# Patient Record
Sex: Female | Born: 1960
Health system: Southern US, Community
[De-identification: ages and names within clinical notes are randomized; demographics above are authoritative.]

## PROBLEM LIST (undated history)

## (undated) DIAGNOSIS — F32A Depression, unspecified: Secondary | ICD-10-CM

## (undated) DIAGNOSIS — K279 Peptic ulcer, site unspecified, unspecified as acute or chronic, without hemorrhage or perforation: Secondary | ICD-10-CM

## (undated) DIAGNOSIS — F419 Anxiety disorder, unspecified: Secondary | ICD-10-CM

## (undated) DIAGNOSIS — I10 Essential (primary) hypertension: Secondary | ICD-10-CM

## (undated) DIAGNOSIS — E119 Type 2 diabetes mellitus without complications: Secondary | ICD-10-CM

## (undated) DIAGNOSIS — F329 Major depressive disorder, single episode, unspecified: Secondary | ICD-10-CM

## (undated) DIAGNOSIS — Z9289 Personal history of other medical treatment: Secondary | ICD-10-CM

## (undated) DIAGNOSIS — E789 Disorder of lipoprotein metabolism, unspecified: Secondary | ICD-10-CM

## (undated) HISTORY — DX: Major depressive disorder, single episode, unspecified: F32.9

## (undated) HISTORY — DX: Disorder of lipoprotein metabolism, unspecified: E78.9

## (undated) HISTORY — DX: Personal history of other medical treatment: Z92.89

## (undated) HISTORY — DX: Essential (primary) hypertension: I10

## (undated) HISTORY — DX: Depression, unspecified: F32.A

## (undated) HISTORY — DX: Anxiety disorder, unspecified: F41.9

## (undated) HISTORY — DX: Type 2 diabetes mellitus without complications: E11.9

---

## 1994-02-07 DIAGNOSIS — K279 Peptic ulcer, site unspecified, unspecified as acute or chronic, without hemorrhage or perforation: Secondary | ICD-10-CM | POA: Insufficient documentation

## 1994-02-07 HISTORY — DX: Peptic ulcer, site unspecified, unspecified as acute or chronic, without hemorrhage or perforation: K27.9

## 1998-09-22 ENCOUNTER — Emergency Department (HOSPITAL_COMMUNITY): Admission: EM | Admit: 1998-09-22 | Discharge: 1998-09-22 | Payer: Self-pay | Admitting: Emergency Medicine

## 1999-08-04 ENCOUNTER — Emergency Department (HOSPITAL_COMMUNITY): Admission: EM | Admit: 1999-08-04 | Discharge: 1999-08-04 | Payer: Self-pay | Admitting: Emergency Medicine

## 1999-11-25 ENCOUNTER — Other Ambulatory Visit: Admission: RE | Admit: 1999-11-25 | Discharge: 1999-11-25 | Payer: Self-pay | Admitting: Family Medicine

## 1999-11-26 ENCOUNTER — Ambulatory Visit (HOSPITAL_COMMUNITY): Admission: RE | Admit: 1999-11-26 | Discharge: 1999-11-26 | Payer: Self-pay | Admitting: Family Medicine

## 1999-11-26 ENCOUNTER — Encounter: Payer: Self-pay | Admitting: Family Medicine

## 2000-08-21 ENCOUNTER — Encounter: Payer: Self-pay | Admitting: Emergency Medicine

## 2000-08-21 ENCOUNTER — Emergency Department (HOSPITAL_COMMUNITY): Admission: EM | Admit: 2000-08-21 | Discharge: 2000-08-21 | Payer: Self-pay | Admitting: Emergency Medicine

## 2000-11-28 ENCOUNTER — Ambulatory Visit (HOSPITAL_COMMUNITY): Admission: RE | Admit: 2000-11-28 | Discharge: 2000-11-28 | Payer: Self-pay | Admitting: Family Medicine

## 2000-11-28 ENCOUNTER — Encounter: Payer: Self-pay | Admitting: Family Medicine

## 2001-07-19 ENCOUNTER — Encounter: Payer: Self-pay | Admitting: Emergency Medicine

## 2001-07-19 ENCOUNTER — Emergency Department (HOSPITAL_COMMUNITY): Admission: EM | Admit: 2001-07-19 | Discharge: 2001-07-19 | Payer: Self-pay | Admitting: Emergency Medicine

## 2001-11-29 ENCOUNTER — Encounter: Payer: Self-pay | Admitting: Family Medicine

## 2001-11-29 ENCOUNTER — Ambulatory Visit (HOSPITAL_COMMUNITY): Admission: RE | Admit: 2001-11-29 | Discharge: 2001-11-29 | Payer: Self-pay | Admitting: Family Medicine

## 2003-02-08 HISTORY — PX: ABDOMINAL HYSTERECTOMY: SHX81

## 2003-02-08 HISTORY — PX: CHOLECYSTECTOMY: SHX55

## 2004-06-01 ENCOUNTER — Inpatient Hospital Stay (HOSPITAL_COMMUNITY): Admission: EM | Admit: 2004-06-01 | Discharge: 2004-06-04 | Payer: Self-pay | Admitting: Emergency Medicine

## 2005-08-24 IMAGING — CT CT PELVIS W/ CM
1 of 3 series · 14 of 32 positions shown, 19 images · IV contrast (omnipaque)
Comparison: none

CLINICAL DATA: Right thigh infection.
 CT PELVIS WITH CONTRAST:
TECHNIQUE: 100 cc Omnipaque 300.  Images were obtained through the pelvis and proximal thighs.   
 The uterus is lobulated and enlarged likely due to uterine fibroids.   The bladder is mildly distended.  Adenopathy is present in the right groin due to an inflammatory process.   There is thickening and stranding involving the musculature of the right proximal thigh; involving the adductor muscles and pectineus.  Stranding is present in the subcutaneous fat of the medial thigh.   There is a focal ill-defined fluid collection in the subcutaneous fat of the medial right proximal thigh measuring 2.9 x 5.6 cm image 8 series 3.

[Series 2: pel 5.0 b40f st · axial · 0.88mm/px · z∈[+746,+940]mm · 14 of 45 slices shown, 19 images]
[im 3/45  soft-tissue]
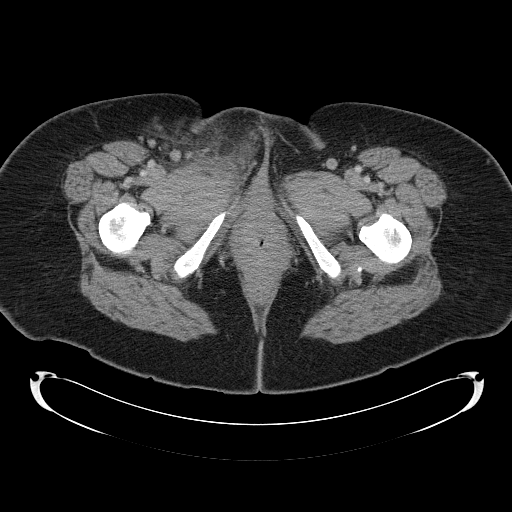
[im 3/45  bone]
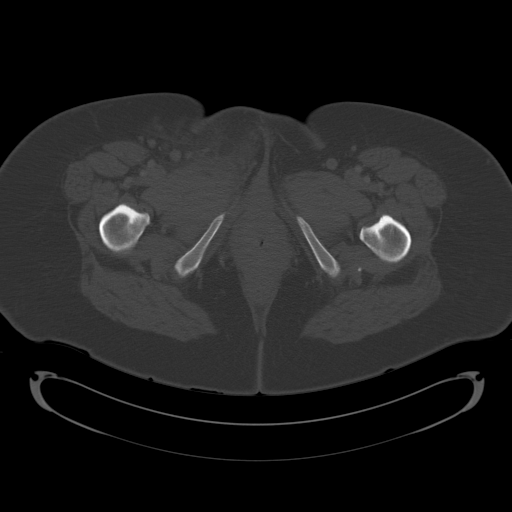
[im 6/45  soft-tissue]
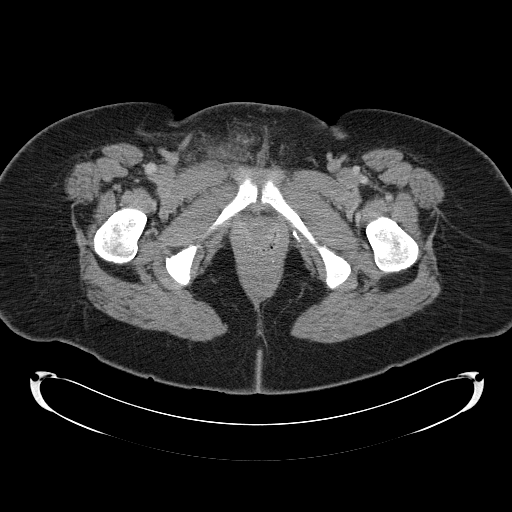
[im 11/45  soft-tissue]
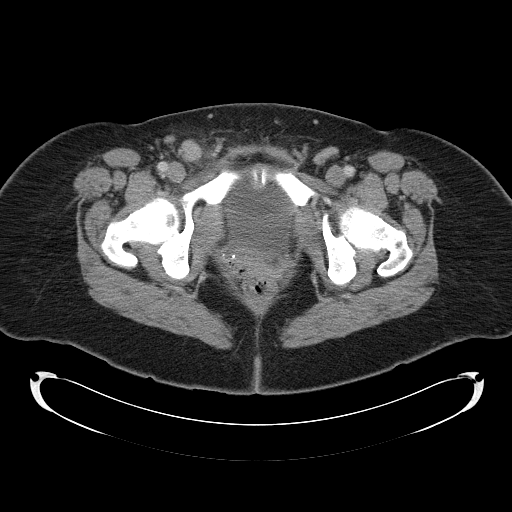
[im 13/45  soft-tissue]
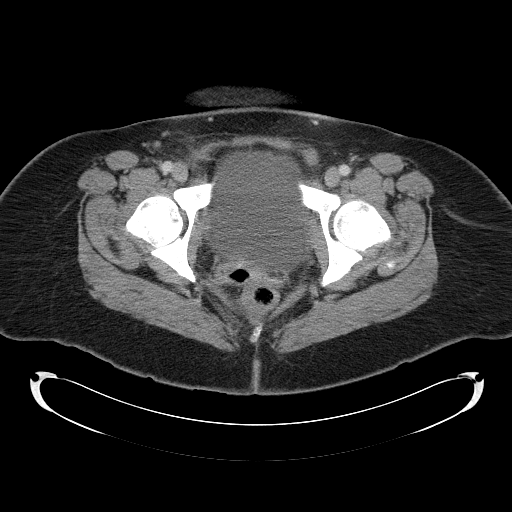
[im 16/45  soft-tissue]
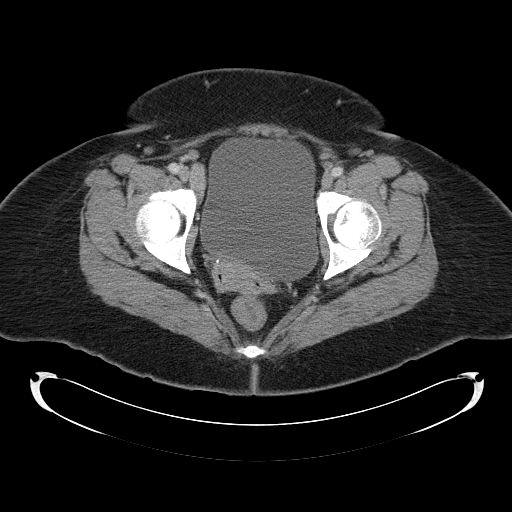
[im 19/45  soft-tissue]
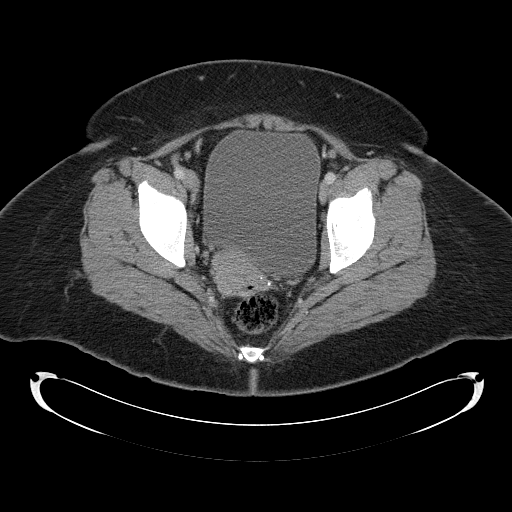
[im 24/45  soft-tissue]
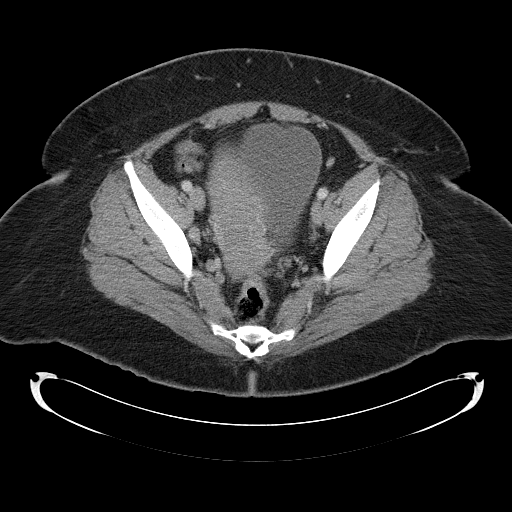
[im 26/45  soft-tissue]
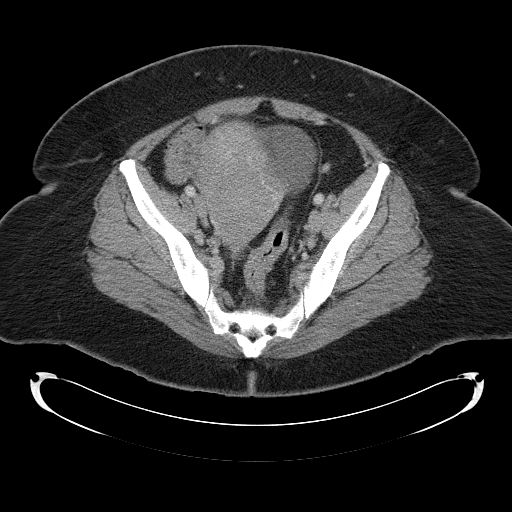
[im 29/45  soft-tissue]
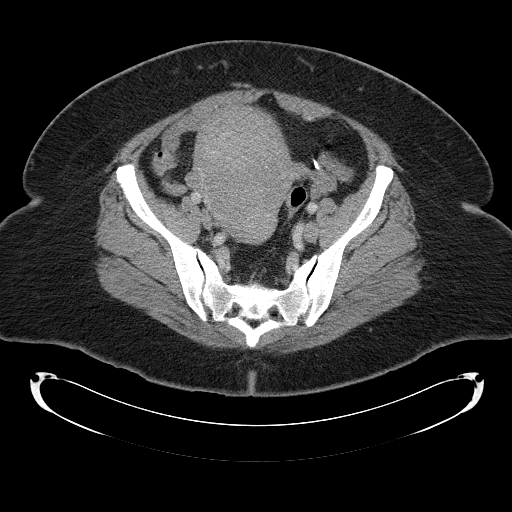
[im 29/45  bone]
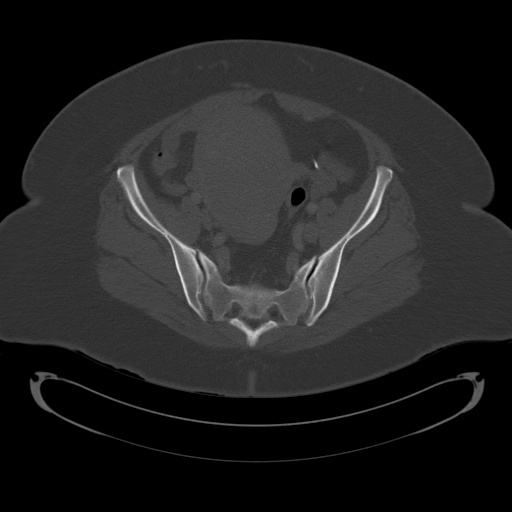
[im 32/45  soft-tissue]
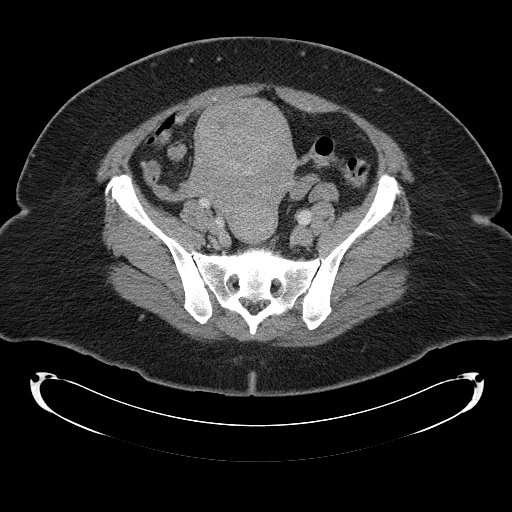
[im 34/45  soft-tissue]
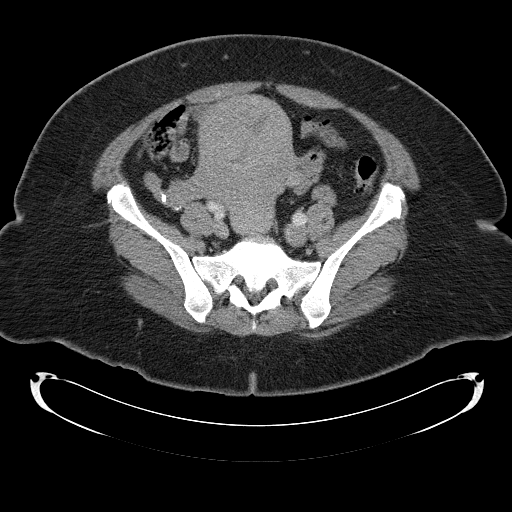
[im 34/45  lung]
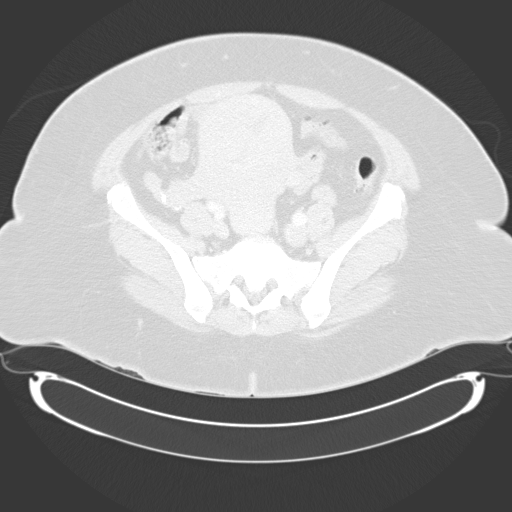
[im 37/45  lung]
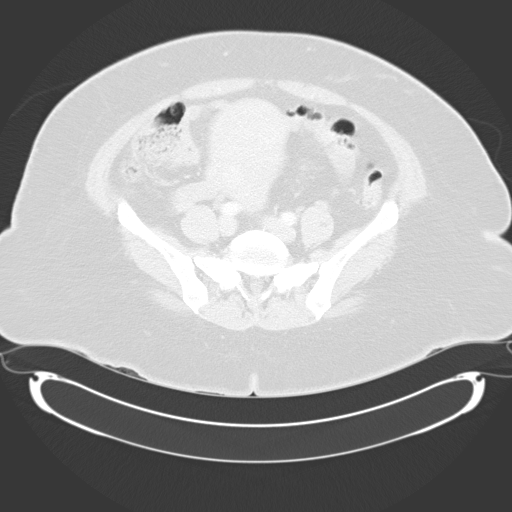
[im 39/45  soft-tissue]
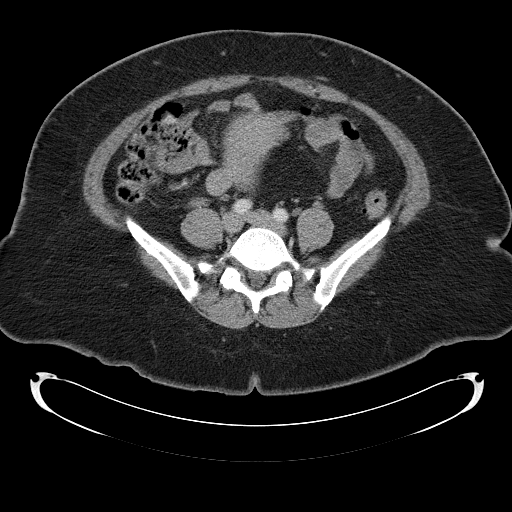
[im 39/45  lung]
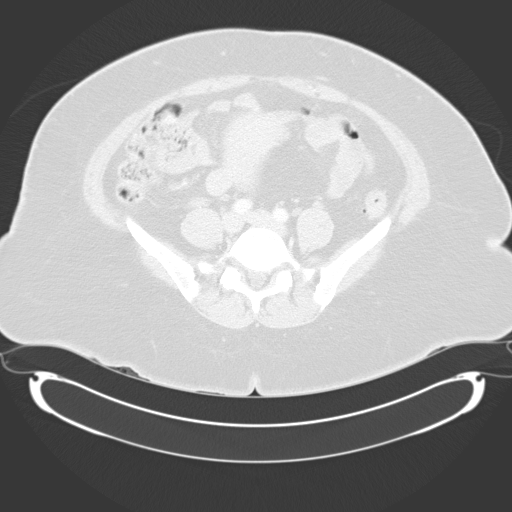
[im 42/45  soft-tissue]
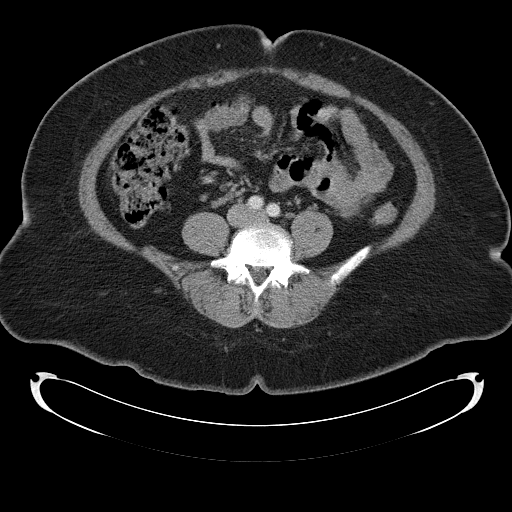
[im 42/45  lung]
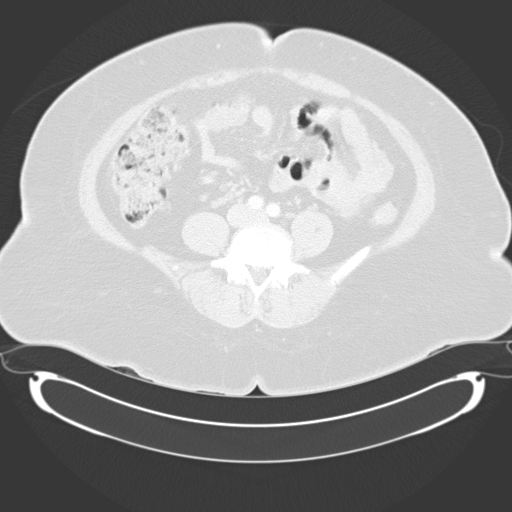

[14 of 32 positions shown; findings below may reference images not displayed]

IMPRESSION: 1.  Enlarged lobulated uterus likely due to fibroids.  This can be further characterized with ultrasound. 
 2.  Inflammatory process in the medial right thigh involving the pectineus muscle, adductor muscles, and subcutaneous fat.  An ill-defined fluid collection within the subcutaneous fat measuring 5.6 cm in greatest dimension is present worrisome for developing abscess.

## 2012-02-08 HISTORY — PX: COLONOSCOPY: SHX174

## 2012-02-08 HISTORY — PX: GASTRIC BYPASS: SHX52

## 2014-02-07 DIAGNOSIS — Z9289 Personal history of other medical treatment: Secondary | ICD-10-CM

## 2014-02-07 HISTORY — DX: Personal history of other medical treatment: Z92.89

## 2014-04-16 DIAGNOSIS — E109 Type 1 diabetes mellitus without complications: Secondary | ICD-10-CM | POA: Diagnosis not present

## 2014-07-24 DIAGNOSIS — E782 Mixed hyperlipidemia: Secondary | ICD-10-CM | POA: Diagnosis not present

## 2014-07-24 DIAGNOSIS — I10 Essential (primary) hypertension: Secondary | ICD-10-CM | POA: Diagnosis not present

## 2014-07-24 DIAGNOSIS — F064 Anxiety disorder due to known physiological condition: Secondary | ICD-10-CM | POA: Diagnosis not present

## 2014-07-24 DIAGNOSIS — E0800 Diabetes mellitus due to underlying condition with hyperosmolarity without nonketotic hyperglycemic-hyperosmolar coma (NKHHC): Secondary | ICD-10-CM | POA: Diagnosis not present

## 2014-07-24 DIAGNOSIS — E6609 Other obesity due to excess calories: Secondary | ICD-10-CM | POA: Diagnosis not present

## 2014-08-13 DIAGNOSIS — F064 Anxiety disorder due to known physiological condition: Secondary | ICD-10-CM | POA: Diagnosis not present

## 2014-08-13 DIAGNOSIS — E0800 Diabetes mellitus due to underlying condition with hyperosmolarity without nonketotic hyperglycemic-hyperosmolar coma (NKHHC): Secondary | ICD-10-CM | POA: Diagnosis not present

## 2014-08-13 DIAGNOSIS — E559 Vitamin D deficiency, unspecified: Secondary | ICD-10-CM | POA: Diagnosis not present

## 2014-08-13 DIAGNOSIS — E6609 Other obesity due to excess calories: Secondary | ICD-10-CM | POA: Diagnosis not present

## 2014-08-13 DIAGNOSIS — B159 Hepatitis A without hepatic coma: Secondary | ICD-10-CM | POA: Diagnosis not present

## 2014-08-13 DIAGNOSIS — E782 Mixed hyperlipidemia: Secondary | ICD-10-CM | POA: Diagnosis not present

## 2014-08-14 DIAGNOSIS — E0800 Diabetes mellitus due to underlying condition with hyperosmolarity without nonketotic hyperglycemic-hyperosmolar coma (NKHHC): Secondary | ICD-10-CM | POA: Diagnosis not present

## 2014-10-02 DIAGNOSIS — E0800 Diabetes mellitus due to underlying condition with hyperosmolarity without nonketotic hyperglycemic-hyperosmolar coma (NKHHC): Secondary | ICD-10-CM | POA: Diagnosis not present

## 2014-10-02 DIAGNOSIS — E782 Mixed hyperlipidemia: Secondary | ICD-10-CM | POA: Diagnosis not present

## 2014-10-02 DIAGNOSIS — I10 Essential (primary) hypertension: Secondary | ICD-10-CM | POA: Diagnosis not present

## 2014-10-02 DIAGNOSIS — F068 Other specified mental disorders due to known physiological condition: Secondary | ICD-10-CM | POA: Diagnosis not present

## 2015-01-05 DIAGNOSIS — E782 Mixed hyperlipidemia: Secondary | ICD-10-CM | POA: Diagnosis not present

## 2015-01-05 DIAGNOSIS — E0800 Diabetes mellitus due to underlying condition with hyperosmolarity without nonketotic hyperglycemic-hyperosmolar coma (NKHHC): Secondary | ICD-10-CM | POA: Diagnosis not present

## 2015-01-05 DIAGNOSIS — E049 Nontoxic goiter, unspecified: Secondary | ICD-10-CM | POA: Diagnosis not present

## 2015-01-05 DIAGNOSIS — I10 Essential (primary) hypertension: Secondary | ICD-10-CM | POA: Diagnosis not present

## 2015-01-05 DIAGNOSIS — Z6832 Body mass index (BMI) 32.0-32.9, adult: Secondary | ICD-10-CM | POA: Diagnosis not present

## 2015-01-05 DIAGNOSIS — E089 Diabetes mellitus due to underlying condition without complications: Secondary | ICD-10-CM | POA: Diagnosis not present

## 2015-01-05 DIAGNOSIS — F064 Anxiety disorder due to known physiological condition: Secondary | ICD-10-CM | POA: Diagnosis not present

## 2015-01-08 DIAGNOSIS — F064 Anxiety disorder due to known physiological condition: Secondary | ICD-10-CM | POA: Diagnosis not present

## 2015-01-08 DIAGNOSIS — E782 Mixed hyperlipidemia: Secondary | ICD-10-CM | POA: Diagnosis not present

## 2015-01-08 DIAGNOSIS — E089 Diabetes mellitus due to underlying condition without complications: Secondary | ICD-10-CM | POA: Diagnosis not present

## 2015-01-08 DIAGNOSIS — I10 Essential (primary) hypertension: Secondary | ICD-10-CM | POA: Diagnosis not present

## 2015-01-19 ENCOUNTER — Telehealth: Payer: Self-pay | Admitting: Oncology

## 2015-01-19 NOTE — Telephone Encounter (Signed)
LT MESS REGARDING NEW PT REFERRAL °

## 2015-01-26 ENCOUNTER — Telehealth: Payer: Self-pay | Admitting: Oncology

## 2015-01-26 NOTE — Telephone Encounter (Signed)
LT MESS REGARDING NEW PT APPT  °

## 2015-01-27 ENCOUNTER — Telehealth: Payer: Self-pay | Admitting: Hematology and Oncology

## 2015-01-27 NOTE — Telephone Encounter (Signed)
new patient appt-s/w patient and gave np appt for 01/03 @ 3:45 w/Dr. Pamelia HoitGudena Referring Dr. Renaye RakersVeita Bland Dx-Anemia- s/p gastric bypass   Referral  [facility]

## 2015-02-09 DIAGNOSIS — H40013 Open angle with borderline findings, low risk, bilateral: Secondary | ICD-10-CM | POA: Diagnosis not present

## 2015-02-09 DIAGNOSIS — H25813 Combined forms of age-related cataract, bilateral: Secondary | ICD-10-CM | POA: Diagnosis not present

## 2015-02-09 DIAGNOSIS — E089 Diabetes mellitus due to underlying condition without complications: Secondary | ICD-10-CM | POA: Diagnosis not present

## 2015-02-09 DIAGNOSIS — E782 Mixed hyperlipidemia: Secondary | ICD-10-CM | POA: Diagnosis not present

## 2015-02-09 DIAGNOSIS — Z23 Encounter for immunization: Secondary | ICD-10-CM | POA: Diagnosis not present

## 2015-02-09 DIAGNOSIS — D530 Protein deficiency anemia: Secondary | ICD-10-CM | POA: Diagnosis not present

## 2015-02-10 ENCOUNTER — Encounter: Payer: Self-pay | Admitting: Hematology and Oncology

## 2015-02-10 ENCOUNTER — Ambulatory Visit (HOSPITAL_BASED_OUTPATIENT_CLINIC_OR_DEPARTMENT_OTHER): Payer: Commercial Managed Care - HMO | Admitting: Hematology and Oncology

## 2015-02-10 VITALS — BP 135/75 | HR 65 | Temp 98.0°F | Resp 18 | Ht 66.0 in | Wt 195.6 lb

## 2015-02-10 DIAGNOSIS — D649 Anemia, unspecified: Secondary | ICD-10-CM | POA: Diagnosis not present

## 2015-02-10 NOTE — Assessment & Plan Note (Signed)
Normocytic anemia with a prior history of gastric bypass surgery 2 years ago with a 60 pound weight loss,  Most recent hemoglobin November 2016 was 10.9 with a normal differential  I discussed with her the mechanism of anemia and its different causes.   Differential diagnosis: 1.  Blood loss: Will obtain stool Hemoccult cards 2.  Combined iron and B-12 deficiencies: We will obtain iron studies and B-12 and folic acid levels. Patient gets B-12 injections once every 3 months. 3.   Hemolysis : We will obtain haptoglobin LDH and reticulocyte count   Patient will have this blood work on Friday morning and we will see her on Friday to review the results of this blood work.  Further recommendations based on results of the blood tests.

## 2015-02-10 NOTE — Progress Notes (Signed)
[facility] CONSULT NOTE  No care team member to display  CHIEF COMPLAINTS/PURPOSE OF CONSULTATION:  Newly diagnosed  anemia  HISTORY OF PRESENTING ILLNESS:  Chelsea Rodriguez 55 y.o. female is here because of recent diagnosis of  Normocytic anemia with a hemoglobin of 10.9. She has a history of gastric bypass surgery in 2014 at [facility]. Since then she had lost 60 pounds. She works currently at [employer] and stays very active. She watches what she eats. She has noted on recent blood work by her primary care physician to have hemoglobin of 10.9 which was decreased from before.  She was referred to [place] for further evaluation of this.  Patient does not report any symptoms of anemia. She feels great having lost 60 pounds.  MEDICAL HISTORY:  Diabetes, GERD, hypertension, hyperlipidemia  SURGICAL HISTORY: hysterectomy, cholecystectomy, gastric bypass surgery  SOCIAL HISTORY:  Smoke cigarettes , denies any alcohol previous marijuana use FAMILY HISTORY:  Her nephew has anemia but otherwise no cancer history;  Father deceased from diabetes and heart disease, mother is 60 years deceased from diabetes heart disease and kidney failure  ALLERGIES:  has no allergies on file.  MEDICATIONS:  Amlodipine 5 mg daily, Lipitor 20 mg daily, Levemir 27 in a.m. And 25 in p.m. , metformin 1000 by mouth twice a day, NovoLog 15 a.m. 15 with lunch and 10 at bedtime, omeprazole 40 mg daily, potassium 20 mEq daily, ranitidine 150 mg daily, B-12 injection 1000 g every 3 months  REVIEW OF SYSTEMS:   Constitutional: Denies fevers, chills or abnormal night sweats Eyes: Denies blurriness of vision, double vision or watery eyes Ears, nose, mouth, throat, and face: Denies mucositis or sore throat Respiratory: Denies cough, dyspnea or wheezes Cardiovascular: Denies palpitation, chest discomfort or lower extremity swelling Gastrointestinal:  Denies nausea, heartburn or change in  bowel habits Skin: Denies abnormal skin rashes Lymphatics: Denies new lymphadenopathy or easy bruising Neurological:Denies numbness, tingling or new weaknesses Behavioral/Psych: Mood is stable, no new changes   All other systems were reviewed with the patient and are negative.  PHYSICAL EXAMINATION: ECOG PERFORMANCE STATUS: 0 - Asymptomatic  Filed Vitals:   02/10/15 1640  BP: 135/75  Pulse: 65  Temp: 98 F (36.7 C)  Resp: 18   Filed Weights   02/10/15 1640  Weight: 195 lb 9.6 oz (88.724 kg)    GENERAL:alert, no distress and comfortable SKIN: skin color, texture, turgor are normal, no rashes or significant lesions EYES: normal, conjunctiva are pink and non-injected, sclera clear OROPHARYNX:no exudate, no erythema and lips, buccal mucosa, and tongue normal  NECK: supple, thyroid normal size, non-tender, without nodularity LYMPH:  no palpable lymphadenopathy in the cervical, axillary or inguinal LUNGS: clear to auscultation and percussion with normal breathing effort HEART: regular rate & rhythm and no murmurs and no lower extremity edema ABDOMEN:abdomen soft, non-tender and normal bowel sounds Musculoskeletal:no cyanosis of digits and no clubbing  PSYCH: alert & oriented x 3 with fluent speech NEURO: no focal motor/sensory deficits   ASSESSMENT AND PLAN:  Normocytic anemia  Normocytic anemia with a prior history of gastric bypass surgery 2 years ago with a 60 pound weight loss,  Most recent hemoglobin November 2016 was 10.9 with a normal differential  I discussed with her the mechanism of anemia and its different causes.   Differential diagnosis: 1.  Blood loss: Will obtain stool Hemoccult cards 2.  Combined iron and B-12 deficiencies: We will obtain iron studies and B-12 and  folic acid levels. Patient gets B-12 injections once every 3 months. 3.   Hemolysis : We will obtain haptoglobin LDH and reticulocyte count   Patient will have this blood work on Friday morning and  we will see her on Friday to review the results of this blood work.  Further recommendations based on results of the blood tests.   All questions were answered. The patient knows to call the clinic with any problems, questions or concerns.    Sabas SousGudena, Aleese Kamps K, MD 02/10/2015

## 2015-02-13 ENCOUNTER — Other Ambulatory Visit (HOSPITAL_BASED_OUTPATIENT_CLINIC_OR_DEPARTMENT_OTHER): Payer: Commercial Managed Care - HMO

## 2015-02-13 ENCOUNTER — Ambulatory Visit (HOSPITAL_BASED_OUTPATIENT_CLINIC_OR_DEPARTMENT_OTHER): Payer: Commercial Managed Care - HMO | Admitting: Hematology and Oncology

## 2015-02-13 ENCOUNTER — Encounter: Payer: Self-pay | Admitting: Hematology and Oncology

## 2015-02-13 VITALS — BP 127/73 | HR 72 | Temp 98.1°F | Resp 18 | Ht 66.0 in | Wt 192.9 lb

## 2015-02-13 DIAGNOSIS — Z9884 Bariatric surgery status: Secondary | ICD-10-CM | POA: Diagnosis not present

## 2015-02-13 DIAGNOSIS — Z862 Personal history of diseases of the blood and blood-forming organs and certain disorders involving the immune mechanism: Secondary | ICD-10-CM

## 2015-02-13 DIAGNOSIS — D649 Anemia, unspecified: Secondary | ICD-10-CM

## 2015-02-13 LAB — CBC & DIFF AND RETIC
BASO%: 0.4 % (ref 0.0–2.0)
BASOS ABS: 0 10*3/uL (ref 0.0–0.1)
EOS%: 1.2 % (ref 0.0–7.0)
Eosinophils Absolute: 0.1 10*3/uL (ref 0.0–0.5)
HCT: 37.3 % (ref 34.8–46.6)
HGB: 12.5 g/dL (ref 11.6–15.9)
IMMATURE RETIC FRACT: 2.2 % (ref 1.60–10.00)
LYMPH#: 1.8 10*3/uL (ref 0.9–3.3)
LYMPH%: 31.3 % (ref 14.0–49.7)
MCH: 28.7 pg (ref 25.1–34.0)
MCHC: 33.5 g/dL (ref 31.5–36.0)
MCV: 85.7 fL (ref 79.5–101.0)
MONO#: 0.4 10*3/uL (ref 0.1–0.9)
MONO%: 6.2 % (ref 0.0–14.0)
NEUT#: 3.4 10*3/uL (ref 1.5–6.5)
NEUT%: 60.9 % (ref 38.4–76.8)
Platelets: 277 10*3/uL (ref 145–400)
RBC: 4.35 10*6/uL (ref 3.70–5.45)
RDW: 13.1 % (ref 11.2–14.5)
RETIC CT ABS: 46.11 10*3/uL (ref 33.70–90.70)
Retic %: 1.06 % (ref 0.70–2.10)
WBC: 5.6 10*3/uL (ref 3.9–10.3)

## 2015-02-13 LAB — IRON AND TIBC
%SAT: 26 % (ref 21–57)
IRON: 93 ug/dL (ref 41–142)
TIBC: 354 ug/dL (ref 236–444)
UIBC: 261 ug/dL (ref 120–384)

## 2015-02-13 LAB — FERRITIN: FERRITIN: 65 ng/mL (ref 9–269)

## 2015-02-13 LAB — FECAL OCCULT BLOOD, GUAIAC: Occult Blood: NEGATIVE

## 2015-02-13 LAB — LACTATE DEHYDROGENASE: LDH: 161 U/L (ref 125–245)

## 2015-02-13 NOTE — Progress Notes (Signed)
No care team member to display  DIAGNOSIS: normocytic anemia.  CHIEF COMPLIANT: follow-up of blood work  INTERVAL HISTORY: Chelsea Rodriguez is a 55 year old with above-mentioned history of normocytic anemia who came in for a evaluation. Repeat blood work done today revealed that her hemoglobin is back to normal. I suspect that she temporarily had anemia of inflammation and subsequently went inflammation subsided, her symptoms normalize. Her iron studies were normal. It does not appear that she has any current iron or B-12 deficiencies. Stool Hemoccults were negative.  REVIEW OF SYSTEMS:   Constitutional: Denies fevers, chills or abnormal weight loss Eyes: Denies blurriness of vision Ears, nose, mouth, throat, and face: Denies mucositis or sore throat Respiratory: Denies cough, dyspnea or wheezes Cardiovascular: Denies palpitation, chest discomfort Gastrointestinal:  Denies nausea, heartburn or change in bowel habits Skin: Denies abnormal skin rashes Lymphatics: Denies new lymphadenopathy or easy bruising Neurological:Denies numbness, tingling or new weaknesses Behavioral/Psych: Mood is stable, no new changes  Extremities: No lower extremity edema All other systems were reviewed with the patient and are negative.  I have reviewed the past medical history, past surgical history, social history and family history with the patient and they are unchanged from previous note.  ALLERGIES:  has no allergies on file.  MEDICATIONS:  Current Outpatient Prescriptions  Medication Sig Dispense Refill  . amLODipine (NORVASC) 5 MG tablet Take 5 mg by mouth daily.    Marland Kitchen. atorvastatin (LIPITOR) 20 MG tablet Take 20 mg by mouth daily.    . insulin aspart (NOVOLOG) 100 UNIT/ML injection Inject into the skin 3 (three) times daily with meals. AM - 15 units.  Lunch - 15 units.  Bedtime - 10 units    . Insulin Detemir (LEVEMIR [place]) Inject 27 Units into the skin every morning.    . Insulin Detemir (LEVEMIR  [place]) Inject 25 Units into the skin at bedtime.    . metFORMIN (GLUMETZA) 1000 MG (MOD) 24 hr tablet Take 1,000 mg by mouth 2 (two) times daily.    Marland Kitchen. omeprazole (PRILOSEC) 40 MG capsule Take 40 mg by mouth daily.    . potassium chloride SA (K-DUR,KLOR-CON) 20 MEQ tablet Take 20 mEq by mouth daily.    . ranitidine (ZANTAC) 150 MG capsule Take 150 mg by mouth daily.     No current facility-administered medications for this visit.    PHYSICAL EXAMINATION: ECOG PERFORMANCE STATUS: 0 - Asymptomatic  Filed Vitals:   02/13/15 0834  BP: 127/73  Pulse: 72  Temp: 98.1 F (36.7 C)  Resp: 18   Filed Weights   02/13/15 0834  Weight: 192 lb 14.4 oz (87.499 kg)    GENERAL:alert, no distress and comfortable SKIN: skin color, texture, turgor are normal, no rashes or significant lesions EYES: normal, Conjunctiva are pink and non-injected, sclera clear OROPHARYNX:no exudate, no erythema and lips, buccal mucosa, and tongue normal  NECK: supple, thyroid normal size, non-tender, without nodularity LYMPH:  no palpable lymphadenopathy in the cervical, axillary or inguinal LUNGS: clear to auscultation and percussion with normal breathing effort HEART: regular rate & rhythm and no murmurs and no lower extremity edema ABDOMEN:abdomen soft, non-tender and normal bowel sounds MUSCULOSKELETAL:no cyanosis of digits and no clubbing  NEURO: alert & oriented x 3 with fluent speech, no focal motor/sensory deficits EXTREMITIES: No lower extremity edema  LABORATORY DATA:  I have reviewed the data as listed   Chemistry   No results found for: NA, K, CL, CO2, BUN, CREATININE, GLU No results found for: CALCIUM, ALKPHOS,  AST, ALT, BILITOT     Lab Results  Component Value Date   WBC 5.6 02/13/2015   HGB 12.5 02/13/2015   HCT 37.3 02/13/2015   MCV 85.7 02/13/2015   PLT 277 02/13/2015   NEUTROABS 3.4 02/13/2015     ASSESSMENT & PLAN:  Normocytic anemia Normocytic anemia with a prior history of gastric  bypass surgery 2 years ago with a 60 pound weight loss Recheck of hemoglobin was 12.3 Stool Hemoccults negative Iron studies were normal with the 26% Iron saturation and a ferritin of 65 Since her anemia has resolved, and there is no evidence of iron deficiency, I recommended that she be followed by her primary care physician and if further her anemia gets worse, she could be seen back by [place].  No orders of the defined types were placed in this encounter.   The patient has a good understanding of the overall plan. she agrees with it. she will call with any problems that may develop before the next visit here.   Sabas Sous, MD 02/13/2015

## 2015-02-13 NOTE — Assessment & Plan Note (Signed)
Normocytic anemia with a prior history of gastric bypass surgery 2 years ago with a 60 pound weight loss Recheck of hemoglobin was 12.3 Stool Hemoccults negative Iron studies were normal with the 26% Iron saturation and a ferritin of 65 Since her anemia has resolved, and there is no evidence of iron deficiency, I recommended that she be followed by her primary care physician and if further her anemia gets worse, she could be seen back by us.

## 2015-02-13 NOTE — Addendum Note (Signed)
Addended by: Lorri FrederickFRANKLIN, Huberta Tompkins K on: 02/13/2015 01:38 PM   Modules accepted: Medications

## 2015-02-15 LAB — HAPTOGLOBIN: HAPTOGLOBIN: 215 mg/dL — AB (ref 43–212)

## 2015-02-15 LAB — VITAMIN B12: Vitamin B-12: 940 pg/mL — ABNORMAL HIGH (ref 211–911)

## 2015-02-15 LAB — FOLATE RBC: RBC FOLATE: 629 ng/mL (ref >280)

## 2015-03-23 DIAGNOSIS — E089 Diabetes mellitus due to underlying condition without complications: Secondary | ICD-10-CM | POA: Diagnosis not present

## 2015-03-23 DIAGNOSIS — D649 Anemia, unspecified: Secondary | ICD-10-CM | POA: Diagnosis not present

## 2015-03-23 DIAGNOSIS — Z6832 Body mass index (BMI) 32.0-32.9, adult: Secondary | ICD-10-CM | POA: Diagnosis not present

## 2015-03-24 DIAGNOSIS — H25813 Combined forms of age-related cataract, bilateral: Secondary | ICD-10-CM | POA: Diagnosis not present

## 2015-03-24 DIAGNOSIS — H40013 Open angle with borderline findings, low risk, bilateral: Secondary | ICD-10-CM | POA: Diagnosis not present

## 2015-04-14 DIAGNOSIS — H40013 Open angle with borderline findings, low risk, bilateral: Secondary | ICD-10-CM | POA: Diagnosis not present

## 2015-04-14 DIAGNOSIS — H25813 Combined forms of age-related cataract, bilateral: Secondary | ICD-10-CM | POA: Diagnosis not present

## 2015-05-18 ENCOUNTER — Encounter (HOSPITAL_COMMUNITY): Payer: Self-pay | Admitting: Emergency Medicine

## 2015-05-18 ENCOUNTER — Emergency Department (HOSPITAL_COMMUNITY)
Admission: EM | Admit: 2015-05-18 | Discharge: 2015-05-18 | Disposition: A | Discharge status: Home / Self Care | Payer: Commercial Managed Care - HMO | Attending: Emergency Medicine | Admitting: Emergency Medicine

## 2015-05-18 ENCOUNTER — Emergency Department (HOSPITAL_COMMUNITY): Payer: Commercial Managed Care - HMO

## 2015-05-18 DIAGNOSIS — E119 Type 2 diabetes mellitus without complications: Secondary | ICD-10-CM | POA: Insufficient documentation

## 2015-05-18 DIAGNOSIS — M545 Low back pain, unspecified: Secondary | ICD-10-CM

## 2015-05-18 DIAGNOSIS — Z79899 Other long term (current) drug therapy: Secondary | ICD-10-CM | POA: Insufficient documentation

## 2015-05-18 DIAGNOSIS — Z7984 Long term (current) use of oral hypoglycemic drugs: Secondary | ICD-10-CM | POA: Diagnosis not present

## 2015-05-18 DIAGNOSIS — Z794 Long term (current) use of insulin: Secondary | ICD-10-CM | POA: Diagnosis not present

## 2015-05-18 DIAGNOSIS — F419 Anxiety disorder, unspecified: Secondary | ICD-10-CM | POA: Insufficient documentation

## 2015-05-18 DIAGNOSIS — I10 Essential (primary) hypertension: Secondary | ICD-10-CM | POA: Diagnosis not present

## 2015-05-18 DIAGNOSIS — Z8711 Personal history of peptic ulcer disease: Secondary | ICD-10-CM | POA: Diagnosis not present

## 2015-05-18 DIAGNOSIS — F329 Major depressive disorder, single episode, unspecified: Secondary | ICD-10-CM | POA: Insufficient documentation

## 2015-05-18 DIAGNOSIS — R109 Unspecified abdominal pain: Secondary | ICD-10-CM | POA: Insufficient documentation

## 2015-05-18 HISTORY — DX: Peptic ulcer, site unspecified, unspecified as acute or chronic, without hemorrhage or perforation: K27.9

## 2015-05-18 LAB — CBC WITH DIFFERENTIAL/PLATELET
BASOS ABS: 0 10*3/uL (ref 0.0–0.1)
BASOS PCT: 0 %
EOS ABS: 0.1 10*3/uL (ref 0.0–0.7)
Eosinophils Relative: 1 %
HEMATOCRIT: 33 % — AB (ref 36.0–46.0)
HEMOGLOBIN: 10.9 g/dL — AB (ref 12.0–15.0)
Lymphocytes Relative: 38 %
Lymphs Abs: 2.4 10*3/uL (ref 0.7–4.0)
MCH: 28 pg (ref 26.0–34.0)
MCHC: 33 g/dL (ref 30.0–36.0)
MCV: 84.8 fL (ref 78.0–100.0)
MONOS PCT: 7 %
Monocytes Absolute: 0.4 10*3/uL (ref 0.1–1.0)
NEUTROS ABS: 3.3 10*3/uL (ref 1.7–7.7)
NEUTROS PCT: 54 %
Platelets: 261 10*3/uL (ref 150–400)
RBC: 3.89 MIL/uL (ref 3.87–5.11)
RDW: 13.6 % (ref 11.5–15.5)
WBC: 6.2 10*3/uL (ref 4.0–10.5)

## 2015-05-18 LAB — URINALYSIS, ROUTINE W REFLEX MICROSCOPIC
BILIRUBIN URINE: NEGATIVE
GLUCOSE, UA: NEGATIVE mg/dL
HGB URINE DIPSTICK: NEGATIVE
Ketones, ur: NEGATIVE mg/dL
Leukocytes, UA: NEGATIVE
Nitrite: NEGATIVE
PH: 5 (ref 5.0–8.0)
Protein, ur: NEGATIVE mg/dL
SPECIFIC GRAVITY, URINE: 1.016 (ref 1.005–1.030)

## 2015-05-18 LAB — COMPREHENSIVE METABOLIC PANEL
ALK PHOS: 54 U/L (ref 38–126)
ALT: 25 U/L (ref 14–54)
ANION GAP: 10 (ref 5–15)
AST: 31 U/L (ref 15–41)
Albumin: 3.4 g/dL — ABNORMAL LOW (ref 3.5–5.0)
BILIRUBIN TOTAL: 0.4 mg/dL (ref 0.3–1.2)
BUN: 11 mg/dL (ref 6–20)
CALCIUM: 8.8 mg/dL — AB (ref 8.9–10.3)
CO2: 23 mmol/L (ref 22–32)
CREATININE: 0.7 mg/dL (ref 0.44–1.00)
Chloride: 110 mmol/L (ref 101–111)
Glucose, Bld: 110 mg/dL — ABNORMAL HIGH (ref 65–99)
Potassium: 3.9 mmol/L (ref 3.5–5.1)
SODIUM: 143 mmol/L (ref 135–145)
TOTAL PROTEIN: 6.3 g/dL — AB (ref 6.5–8.1)

## 2015-05-18 MED ORDER — ONDANSETRON HCL 4 MG/2ML IJ SOLN
4.0000 mg | Freq: Once | INTRAMUSCULAR | Status: AC
  Administered 2015-05-18: 4 mg via INTRAVENOUS
  Filled 2015-05-18: qty 2

## 2015-05-18 MED ORDER — SODIUM CHLORIDE 0.9 % IV BOLUS (SEPSIS)
1000.0000 mL | Freq: Once | INTRAVENOUS | Status: AC
  Administered 2015-05-18: 1000 mL via INTRAVENOUS

## 2015-05-18 MED ORDER — METHOCARBAMOL 500 MG PO TABS
1000.0000 mg | ORAL_TABLET | Freq: Once | ORAL | Status: AC
  Administered 2015-05-18: 1000 mg via ORAL
  Filled 2015-05-18: qty 2

## 2015-05-18 MED ORDER — MORPHINE SULFATE (PF) 4 MG/ML IV SOLN
4.0000 mg | Freq: Once | INTRAVENOUS | Status: AC
  Administered 2015-05-18: 4 mg via INTRAVENOUS
  Filled 2015-05-18: qty 1

## 2015-05-18 NOTE — ED Provider Notes (Signed)
CSN: [id number]     Arrival date & time 05/18/15  1604 History   First MD Initiated Contact with Patient 05/18/15 1943     Chief Complaint  Patient presents with  . Flank Pain     (Consider location/radiation/quality/duration/timing/severity/associated sxs/prior Treatment) HPI    Chelsea Rodriguez is a 55 y.o. female, with a history of DM, hypertension, and PUD, presenting to the ED with left flank pain that began yesterday. Pt rates her pain 10/10, sharp, nonradiating. Pt states pain is worse with movement. Patient states that she was sitting down when the pain began. Pt has not taken any medications for the pain. States she has peptic ulcers so she can not take ASA or NSAIDS. Denies urinary complaints, trauma/falls, fever/chills, N/V, abdominal pain, abnormal vaginal discharge or bleeding, or any other complaints.     Past Medical History  Diagnosis Date  . Diabetes mellitus without complication (HCC)   . Hypertension   . Lipid disorder   . Hypertension   . H/O mammogram 2016  . Anxiety   . Depression   . Peptic ulcer disease 1996   Past Surgical History  Procedure Laterality Date  . Gastric bypass  2014  . Abdominal hysterectomy  2005  . Cholecystectomy  2005  . Colonoscopy  2014   Family History  Problem Relation Age of Onset  . Diabetes Mother   . Heart disease Mother   . Heart disease Father   . Diabetes Father    Social History  Substance Use Topics  . Smoking status: Never Smoker   . Smokeless tobacco: None  . Alcohol Use: No   OB History    No data available     Review of Systems  Constitutional: Negative for fever, chills and diaphoresis.  Gastrointestinal: Negative for nausea, vomiting, abdominal pain, diarrhea and constipation.  Genitourinary: Positive for flank pain. Negative for dysuria, hematuria, vaginal bleeding and vaginal discharge.  Musculoskeletal: Positive for back pain.  All other systems reviewed and are negative.     Allergies   Review of patient's allergies indicates no known allergies.  Home Medications   Prior to Admission medications   Medication Sig Start Date End Date Taking? Authorizing Provider  amLODipine (NORVASC) 5 MG tablet Take 5 mg by mouth daily.   Yes Historical Provider, MD  atorvastatin (LIPITOR) 20 MG tablet Take 20 mg by mouth daily.   Yes Historical Provider, MD  furosemide (LASIX) 80 MG tablet Take 80 mg by mouth every morning. 04/24/15  Yes Historical Provider, MD  insulin aspart (NOVOLOG) 100 UNIT/ML injection Inject into the skin 3 (three) times daily with meals. AM - 15 units.  Lunch - 15 units.  Bedtime - 10 units   Yes Historical Provider, MD  insulin detemir (LEVEMIR) 100 UNIT/ML injection Inject 25-27 Units into the skin 2 (two) times daily. Takes 27 units in am and 25 units in pm   Yes Historical Provider, MD  latanoprost (XALATAN) 0.005 % ophthalmic solution Place 1 drop into both eyes daily. 03/25/15  Yes Historical Provider, MD  metFORMIN (GLUCOPHAGE) 1000 MG tablet Take 1,000 mg by mouth 2 (two) times daily with a meal.   Yes Historical Provider, MD  omeprazole (PRILOSEC) 40 MG capsule Take 40 mg by mouth every evening.    Yes Historical Provider, MD  potassium chloride SA (K-DUR,KLOR-CON) 20 MEQ tablet Take 20 mEq by mouth daily.   Yes Historical Provider, MD  ranitidine (ZANTAC) 150 MG capsule Take 150 mg by mouth every morning.  Yes Historical Provider, MD  Insulin Detemir (LEVEMIR [place]) Inject 25 Units into the skin at bedtime.    Historical Provider, MD   BP 125/88 mmHg  Pulse 69  Temp(Src) 98.9 F (37.2 C) (Oral)  Resp 16  Ht  (1.676 m)  Wt 88.905 kg  BMI 31.65 kg/m2  SpO2 100% Physical Exam  Constitutional: She is oriented to person, place, and time. She appears well-developed and well-nourished. No distress.  HENT:  Head: Normocephalic and atraumatic.  Eyes: Conjunctivae are normal. Pupils are equal, round, and reactive to light.  Neck: Neck supple.   Cardiovascular: Normal rate, regular rhythm, normal heart sounds and intact distal pulses.   Pulmonary/Chest: Effort normal and breath sounds normal. No respiratory distress.  Abdominal: Soft. There is no tenderness. There is CVA tenderness (left). There is no guarding.  Difficult to discern if the patient actually has CVA tenderness or simply tenderness to the overlying musculature.  Musculoskeletal: She exhibits no edema or tenderness.  Patient is tender to the touch in the left lumbar musculature. No tenderness in the patient's left flank. Full ROM in all extremities and spine. No paraspinal tenderness.   Lymphadenopathy:    She has no cervical adenopathy.  Neurological: She is alert and oriented to person, place, and time. She has normal reflexes.  No sensory deficits. Strength 5/5 in all extremities. No gait disturbance. Coordination intact.   Skin: Skin is warm and dry. She is not diaphoretic.  Psychiatric: She has a normal mood and affect. Her behavior is normal.  Nursing note and vitals reviewed.   ED Course  Procedures (including critical care time) Labs Review Labs Reviewed  COMPREHENSIVE METABOLIC PANEL - Abnormal; Notable for the following:    Glucose, Bld 110 (*)    Calcium 8.8 (*)    Total Protein 6.3 (*)    Albumin 3.4 (*)    All other components within normal limits  CBC WITH DIFFERENTIAL/PLATELET - Abnormal; Notable for the following:    Hemoglobin 10.9 (*)    HCT 33.0 (*)    All other components within normal limits  URINE CULTURE  URINALYSIS, ROUTINE W REFLEX MICROSCOPIC (NOT AT [facility])    Imaging Review US Abdomen Complete  05/18/2015  CLINICAL DATA:  Left-sided flank pain. EXAM: ABDOMEN ULTRASOUND COMPLETE COMPARISON:  None. FINDINGS: Gallbladder: Surgically absent. Common bile duct: Diameter: Maximum diameter of approximately 11 mm. Dilatation may be on the basis of prior cholecystectomy. No obvious evidence of choledocholithiasis by ultrasound. Liver: No  focal lesion identified. Within normal limits in parenchymal echogenicity. IVC: No abnormality visualized. Pancreas: Visualized portion unremarkable. Spleen: Size and appearance within normal limits. Right Kidney: Length: 10.7 cm. Echogenicity within normal limits. No mass or hydronephrosis visualized. Left Kidney: Length: 10.5 cm. Echogenicity within normal limits. No mass or hydronephrosis visualized. Abdominal aorta: No aneurysm visualized. Other findings: None. IMPRESSION: Dilatation of the common bile duct post cholecystectomy. No ultrasound evidence of choledocholithiasis. Electronically Signed   By: Irish Lack M.D.   On: 05/18/2015 20:56   I have personally reviewed and evaluated these images and lab results as part of my medical decision-making.   EKG Interpretation None      MDM   Final diagnoses:  Left flank pain  Left-sided low back pain without sciatica    Chelsea Rodriguez presents with left flank pain that began yesterday.  Findings and plan of care discussed with Lyndal Pulley, MD. Dr. Clydene Pugh personally evaluated and examined this patient.  This patient's presentation is consistent  with either renal stone or musculoskeletal pain. No red flag symptoms. No signs of sepsis or other serious illness. Neuro exam performed due to the location of the patient's pain and tenderness. IV fluids, morphine, Zofran, basic labs, and UA obtained. No abnormalities on the patient's abdominal ultrasound. Specifically, no renal stones. Patient's labs show no significant acute abnormalities. Upon reassessment, patient states that her pain has improved significantly with morphine. Patient had complete relief of her symptoms following Robaxin administration. This, combined with the ultrasound results, give evidence for a musculoskeletal source for the patient's pain. Patient to follow-up with PCP should symptoms continue. Home care and return precautions discussed. Patient voices understanding of these  instructions and is comfortable with discharge.   Filed Vitals:   05/18/15 1621 05/18/15 1902 05/18/15 2100  BP: 112/79 112/71 118/78  Pulse:  62 63  Temp: 98.2 F (36.8 C) 98.9 F (37.2 C)   TempSrc: Oral Oral   Resp: 15 16 16   Height: 5\' 6"  (1.676 m)    Weight: 88.905 kg    SpO2: 100% 100% 97%   Filed Vitals:   05/18/15 2100 05/18/15 2115 05/18/15 2225 05/18/15 2300  BP: 118/78 115/71 107/75 125/88  Pulse: 63 57 58 69  Temp:      TempSrc:      Resp: 16 14  16   Height:      Weight:      SpO2: 97% 100% 98% 100%        Anselm PancoastShawn C Nyala Kirchner, PA-C 05/18/15 2356  Lyndal Pulleyaniel Knott, MD 05/19/15 [phone]

## 2015-05-18 NOTE — Discharge Instructions (Signed)

## 2015-05-18 NOTE — ED Notes (Signed)
Pt states she has been having left flank pain since yesterday. Pt denies trouble urinating. Pt states she has increased pain with movement. Pt denies any injury

## 2015-05-18 NOTE — ED Notes (Signed)
Patient transported to Ultrasound 

## 2015-05-18 NOTE — ED Notes (Signed)
Pt verbalized understanding of discharge instructions and follow-up care. Vital signs stable at discharge. 

## 2015-05-20 LAB — URINE CULTURE

## 2015-06-25 DIAGNOSIS — E089 Diabetes mellitus due to underlying condition without complications: Secondary | ICD-10-CM | POA: Diagnosis not present

## 2015-06-25 DIAGNOSIS — D644 Congenital dyserythropoietic anemia: Secondary | ICD-10-CM | POA: Diagnosis not present

## 2015-06-25 DIAGNOSIS — E782 Mixed hyperlipidemia: Secondary | ICD-10-CM | POA: Diagnosis not present

## 2015-06-25 DIAGNOSIS — E6609 Other obesity due to excess calories: Secondary | ICD-10-CM | POA: Diagnosis not present

## 2015-06-25 DIAGNOSIS — D649 Anemia, unspecified: Secondary | ICD-10-CM | POA: Diagnosis not present

## 2015-07-30 DIAGNOSIS — H6121 Impacted cerumen, right ear: Secondary | ICD-10-CM | POA: Diagnosis not present

## 2015-07-30 DIAGNOSIS — E119 Type 2 diabetes mellitus without complications: Secondary | ICD-10-CM | POA: Diagnosis not present

## 2015-09-09 DIAGNOSIS — E119 Type 2 diabetes mellitus without complications: Secondary | ICD-10-CM | POA: Diagnosis not present

## 2015-09-09 DIAGNOSIS — E785 Hyperlipidemia, unspecified: Secondary | ICD-10-CM | POA: Diagnosis not present

## 2015-09-09 DIAGNOSIS — E538 Deficiency of other specified B group vitamins: Secondary | ICD-10-CM | POA: Diagnosis not present

## 2015-09-09 DIAGNOSIS — D649 Anemia, unspecified: Secondary | ICD-10-CM | POA: Diagnosis not present

## 2015-09-09 DIAGNOSIS — I10 Essential (primary) hypertension: Secondary | ICD-10-CM | POA: Diagnosis not present

## 2015-09-09 DIAGNOSIS — E669 Obesity, unspecified: Secondary | ICD-10-CM | POA: Diagnosis not present

## 2015-09-09 DIAGNOSIS — E559 Vitamin D deficiency, unspecified: Secondary | ICD-10-CM | POA: Diagnosis not present

## 2015-10-28 DIAGNOSIS — H40053 Ocular hypertension, bilateral: Secondary | ICD-10-CM | POA: Diagnosis not present

## 2015-11-05 DIAGNOSIS — Z23 Encounter for immunization: Secondary | ICD-10-CM | POA: Diagnosis not present

## 2015-11-06 DIAGNOSIS — H40053 Ocular hypertension, bilateral: Secondary | ICD-10-CM | POA: Diagnosis not present

## 2015-11-27 DIAGNOSIS — H40053 Ocular hypertension, bilateral: Secondary | ICD-10-CM | POA: Diagnosis not present

## 2016-01-08 DIAGNOSIS — H25813 Combined forms of age-related cataract, bilateral: Secondary | ICD-10-CM | POA: Diagnosis not present

## 2016-01-08 DIAGNOSIS — H40053 Ocular hypertension, bilateral: Secondary | ICD-10-CM | POA: Diagnosis not present

## 2016-02-02 ENCOUNTER — Encounter (HOSPITAL_COMMUNITY): Payer: Self-pay | Admitting: Emergency Medicine

## 2016-02-02 ENCOUNTER — Ambulatory Visit (HOSPITAL_COMMUNITY)
Admission: EM | Admit: 2016-02-02 | Discharge: 2016-02-02 | Disposition: A | Discharge status: Home / Self Care | Payer: Commercial Managed Care - HMO | Attending: Family Medicine | Admitting: Family Medicine

## 2016-02-02 DIAGNOSIS — M542 Cervicalgia: Secondary | ICD-10-CM

## 2016-02-02 DIAGNOSIS — H6123 Impacted cerumen, bilateral: Secondary | ICD-10-CM

## 2016-02-02 DIAGNOSIS — M62838 Other muscle spasm: Secondary | ICD-10-CM | POA: Diagnosis not present

## 2016-02-02 MED ORDER — CYCLOBENZAPRINE HCL 10 MG PO TABS: 10.0000 mg | ORAL_TABLET | Freq: Two times a day (BID) | ORAL | 0 refills | Status: DC | PRN

## 2016-02-02 MED ORDER — CARBAMIDE PEROXIDE 6.5 % OT SOLN: 5.0000 [drp] | Freq: Two times a day (BID) | OTIC | 0 refills | Status: AC

## 2016-02-02 NOTE — Discharge Instructions (Signed)
Do not drive on medications  Take motrin as needed If sx are not better in 2 weeks may need to see ortho for further test  Use heat and ice as needed Wax in ears may use drops and warm water to help.  Avoid placing anything such as q tips in ears.

## 2016-02-02 NOTE — ED Triage Notes (Signed)
Neck pain for 2 weeks, no injury.   Both ears are stuffy, but do not hurt.  Patient says any sound is an echo

## 2016-02-02 NOTE — ED Provider Notes (Signed)
CSN: [id number]     Arrival date & time 02/02/16  1121 History   First MD Initiated Contact with Patient 02/02/16 1308     Chief Complaint  Patient presents with  . Neck Pain   (Consider location/radiation/quality/duration/timing/severity/associated sxs/prior Treatment) For 2 weeks now feels like a tight muscle cramp in neck area. Denies any injury, denies any lumbar problems in the past. Pt states that she felt like she slept wrong when it began hurting. Has only taken motrin for this.  Also c/o of fullness to bil ears 3 days . States that she does not clean ears and has had a hx of wax build up. Denies any nasal congestion. Has not taken anything for this.       Past Medical History:  Diagnosis Date  . Anxiety   . Depression   . Diabetes mellitus without complication (HCC)   . H/O mammogram 2016  . Hypertension   . Hypertension   . Lipid disorder   . Peptic ulcer disease 1996   Past Surgical History:  Procedure Laterality Date  . ABDOMINAL HYSTERECTOMY  2005  . CHOLECYSTECTOMY  2005  . COLONOSCOPY  2014  . GASTRIC BYPASS  2014   Family History  Problem Relation Age of Onset  . Diabetes Mother   . Heart disease Mother   . Heart disease Father   . Diabetes Father    Social History  Substance Use Topics  . Smoking status: Never Smoker  . Smokeless tobacco: Not on file  . Alcohol use No   OB History    No data available     Review of Systems  Constitutional: Negative.   HENT:       Bil ear fullness, denies any pain   Eyes: Negative.   Respiratory: Negative.   Cardiovascular: Negative.   Musculoskeletal:       Back to neck and bil upper shoulder area   Neurological: Negative.     Allergies  Patient has no known allergies.  Home Medications   Prior to Admission medications   Medication Sig Start Date End Date Taking? Authorizing Provider  amLODipine (NORVASC) 5 MG tablet Take 5 mg by mouth daily.    Historical Provider, MD  atorvastatin (LIPITOR) 20  MG tablet Take 20 mg by mouth daily.    Historical Provider, MD  furosemide (LASIX) 80 MG tablet Take 80 mg by mouth every morning. 04/24/15   Historical Provider, MD  insulin aspart (NOVOLOG) 100 UNIT/ML injection Inject into the skin 3 (three) times daily with meals. AM - 15 units.  Lunch - 15 units.  Bedtime - 10 units    Historical Provider, MD  Insulin Detemir (LEVEMIR Chelsea Rodriguez) Inject 25 Units into the skin at bedtime.    Historical Provider, MD  insulin detemir (LEVEMIR) 100 UNIT/ML injection Inject 25-27 Units into the skin 2 (two) times daily. Takes 27 units in am and 25 units in pm    Historical Provider, MD  latanoprost (XALATAN) 0.005 % ophthalmic solution Place 1 drop into both eyes daily. 03/25/15   Historical Provider, MD  metFORMIN (GLUCOPHAGE) 1000 MG tablet Take 1,000 mg by mouth 2 (two) times daily with a meal.    Historical Provider, MD  omeprazole (PRILOSEC) 40 MG capsule Take 40 mg by mouth every evening.     Historical Provider, MD  potassium chloride SA (K-DUR,KLOR-CON) 20 MEQ tablet Take 20 mEq by mouth daily.    Historical Provider, MD  ranitidine (ZANTAC) 150 MG capsule Take 150  mg by mouth every morning.     Historical Provider, MD   Meds Ordered and Administered this Visit  Medications - No data to display  BP 112/63 (BP Location: Left Arm)   Pulse 71   Temp 98.5 F (36.9 C) (Oral)   Resp 16   SpO2 99%  No data found.   Physical Exam  Constitutional: She appears well-developed.  HENT:  Small amount of wax build up to bil ears, no erythema   Eyes: Pupils are equal, round, and reactive to light.  Neck: Normal range of motion.  Cardiovascular: Normal rate.   Pulmonary/Chest: Effort normal and breath sounds normal.  Musculoskeletal:  tenderness to cervical area radiates to lt upper shoulder   Neurological: She is alert.  Skin: Skin is warm.    Urgent Care Course   Clinical Course     Procedures (including critical care time)  Labs Review Labs Reviewed -  No data to display  Imaging Review No results found.            MDM   1. Neck pain   2. Muscle spasms of neck   3. Bilateral impacted cerumen    Do not drive on medications  Take motrin as needed If sx are not better in 2 weeks may need to see ortho for further test  Use heat and ice as needed Wax in ears may use drops and warm water to help.  Avoid placing anything such as q tips in ears.    Chelsea BastosMelanie A Nelline Lio, NP 02/02/16 1318

## 2016-02-19 ENCOUNTER — Ambulatory Visit: Payer: Commercial Managed Care - HMO | Attending: Family Medicine | Admitting: Family Medicine

## 2016-02-19 ENCOUNTER — Encounter: Payer: Self-pay | Admitting: Family Medicine

## 2016-02-19 VITALS — BP 131/72 | HR 89 | Temp 98.3°F | Ht 66.0 in | Wt 190.2 lb

## 2016-02-19 DIAGNOSIS — K219 Gastro-esophageal reflux disease without esophagitis: Secondary | ICD-10-CM | POA: Diagnosis not present

## 2016-02-19 DIAGNOSIS — I10 Essential (primary) hypertension: Secondary | ICD-10-CM | POA: Diagnosis not present

## 2016-02-19 DIAGNOSIS — R600 Localized edema: Secondary | ICD-10-CM

## 2016-02-19 DIAGNOSIS — E119 Type 2 diabetes mellitus without complications: Secondary | ICD-10-CM | POA: Insufficient documentation

## 2016-02-19 DIAGNOSIS — K279 Peptic ulcer, site unspecified, unspecified as acute or chronic, without hemorrhage or perforation: Secondary | ICD-10-CM | POA: Diagnosis not present

## 2016-02-19 DIAGNOSIS — Z9884 Bariatric surgery status: Secondary | ICD-10-CM | POA: Diagnosis not present

## 2016-02-19 DIAGNOSIS — E789 Disorder of lipoprotein metabolism, unspecified: Secondary | ICD-10-CM | POA: Diagnosis not present

## 2016-02-19 DIAGNOSIS — F329 Major depressive disorder, single episode, unspecified: Secondary | ICD-10-CM | POA: Diagnosis not present

## 2016-02-19 DIAGNOSIS — H9192 Unspecified hearing loss, left ear: Secondary | ICD-10-CM | POA: Diagnosis not present

## 2016-02-19 DIAGNOSIS — E785 Hyperlipidemia, unspecified: Secondary | ICD-10-CM | POA: Insufficient documentation

## 2016-02-19 DIAGNOSIS — Z794 Long term (current) use of insulin: Secondary | ICD-10-CM | POA: Insufficient documentation

## 2016-02-19 DIAGNOSIS — F419 Anxiety disorder, unspecified: Secondary | ICD-10-CM | POA: Insufficient documentation

## 2016-02-19 DIAGNOSIS — Z9889 Other specified postprocedural states: Secondary | ICD-10-CM

## 2016-02-19 LAB — GLUCOSE, POCT (MANUAL RESULT ENTRY): POC Glucose: 140 mg/dl — AB (ref 70–99)

## 2016-02-19 LAB — POCT GLYCOSYLATED HEMOGLOBIN (HGB A1C): Hemoglobin A1C: 7.6

## 2016-02-19 MED ORDER — INSULIN ASPART 100 UNIT/ML ~~LOC~~ SOLN: SUBCUTANEOUS | 5 refills | Status: DC

## 2016-02-19 MED ORDER — FUROSEMIDE 20 MG PO TABS: 80.0000 mg | ORAL_TABLET | Freq: Every morning | ORAL | 5 refills | Status: DC

## 2016-02-19 MED ORDER — LISINOPRIL 20 MG PO TABS: 20.0000 mg | ORAL_TABLET | Freq: Every day | ORAL | 5 refills | Status: DC

## 2016-02-19 MED ORDER — ATORVASTATIN CALCIUM 20 MG PO TABS: 20.0000 mg | ORAL_TABLET | Freq: Every day | ORAL | 5 refills | Status: DC

## 2016-02-19 MED ORDER — METFORMIN HCL 1000 MG PO TABS: 1000.0000 mg | ORAL_TABLET | Freq: Two times a day (BID) | ORAL | 5 refills | Status: DC

## 2016-02-19 MED ORDER — OMEPRAZOLE 40 MG PO CPDR: 40.0000 mg | DELAYED_RELEASE_CAPSULE | Freq: Every evening | ORAL | 5 refills | Status: DC

## 2016-02-19 MED ORDER — INSULIN DETEMIR 100 UNIT/ML ~~LOC~~ SOLN: SUBCUTANEOUS | 11 refills | Status: DC

## 2016-02-19 NOTE — Progress Notes (Signed)
Subjective:  Patient ID: Chelsea Rodriguez, female    DOB: 07/13/1960  Age: 56 y.o. MRN: [id number]  CC: Diabetes; Leg Swelling; Gastroesophageal Reflux; ear issue (left ear can't hear); and Hypertension   HPI [name] is a 56 year old female with a history of type 2 diabetes mellitus (A1c 7.6), hypertension, hyperlipidemia, peptic ulcer disease, previous history of gastric bypass in 07/2012 who presents to the clinic today to get established. She has not seen her PCP in the last 6 months.  She denies hypoglycemia, numbness in extremities or visual symptoms and her blood sugars reveal fastings in the 140s.  She has been taking vitamin B12 injections (initially every month and subsequently every 3 months);  received the medications from the pharmacy 3 years ago and has been using is not needing refills. She denies chest pains or shortness of breath. Has been compliant with all medications.  Past Medical History:  Diagnosis Date  . Anxiety   . Depression   . Diabetes mellitus without complication (HCC)   . H/O mammogram 2016  . Hypertension   . Hypertension   . Lipid disorder   . Peptic ulcer disease 1996    Past Surgical History:  Procedure Laterality Date  . ABDOMINAL HYSTERECTOMY  2005  . CHOLECYSTECTOMY  2005  . COLONOSCOPY  2014  . GASTRIC BYPASS  2014    No Known Allergies   Outpatient Medications Prior to Visit  Medication Sig Dispense Refill  . carbamide peroxide (DEBROX) 6.5 % otic solution Place 5 drops into both ears 2 (two) times daily. 15 mL 0  . amLODipine (NORVASC) 5 MG tablet Take 5 mg by mouth daily.    Marland Kitchen atorvastatin (LIPITOR) 20 MG tablet Take 20 mg by mouth daily.    . furosemide (LASIX) 80 MG tablet Take 80 mg by mouth every morning.    . insulin aspart (NOVOLOG) 100 UNIT/ML injection Inject into the skin 3 (three) times daily with meals. AM - 15 units.  Lunch - 15 units.  Bedtime - 10 units    . Insulin Detemir (LEVEMIR [place]) Inject 25 Units into  the skin at bedtime.    . insulin detemir (LEVEMIR) 100 UNIT/ML injection Inject 25-27 Units into the skin 2 (two) times daily. Takes 27 units in am and 25 units in pm    . metFORMIN (GLUCOPHAGE) 1000 MG tablet Take 1,000 mg by mouth 2 (two) times daily with a meal.    . omeprazole (PRILOSEC) 40 MG capsule Take 40 mg by mouth every evening.     . potassium chloride SA (K-DUR,KLOR-CON) 20 MEQ tablet Take 20 mEq by mouth daily.    . ranitidine (ZANTAC) 150 MG capsule Take 150 mg by mouth every morning.     . cyclobenzaprine (FLEXERIL) 10 MG tablet Take 1 tablet (10 mg total) by mouth 2 (two) times daily as needed for muscle spasms. (Patient not taking: Reported on 02/19/2016) 20 tablet 0  . latanoprost (XALATAN) 0.005 % ophthalmic solution Place 1 drop into both eyes daily.     No facility-administered medications prior to visit.     ROS Review of Systems  Constitutional: Negative for activity change, appetite change and fatigue.  HENT: Negative for congestion, sinus pressure and sore throat.   Eyes: Negative for visual disturbance.  Respiratory: Negative for cough, chest tightness, shortness of breath and wheezing.   Cardiovascular: Negative for chest pain and palpitations.  Gastrointestinal: Negative for abdominal distention, abdominal pain and constipation.  Endocrine: Negative for polydipsia.  Genitourinary: Negative for dysuria and frequency.  Musculoskeletal: Negative for arthralgias and back pain.  Skin: Negative for rash.  Neurological: Negative for tremors, light-headedness and numbness.  Hematological: Does not bruise/bleed easily.  Psychiatric/Behavioral: Negative for agitation and behavioral problems.    Objective:  BP 131/72 (BP Location: Right Arm, Patient Position: Sitting, Cuff Size: Small)   Pulse 89   Temp 98.3 F (36.8 C) (Oral)   Ht 5\' 6"  (1.676 m)   Wt 190 lb 3.2 oz (86.3 kg)   SpO2 99%   BMI 30.70 kg/m   BP/Weight 02/19/2016 02/02/2016 05/18/2015  Systolic BP  131 161 125  Diastolic BP 72 63 88  Wt. (Lbs) 190.2 - 196  BMI 30.7 - 31.65      Physical Exam  Constitutional: She is oriented to person, place, and time. She appears well-developed and well-nourished.  Neck: No JVD present.  Cardiovascular: Normal rate, normal heart sounds and intact distal pulses.   No murmur heard. Pulmonary/Chest: Effort normal and breath sounds normal. She has no wheezes. She has no rales. She exhibits no tenderness.  Abdominal: Soft. Bowel sounds are normal. She exhibits no distension and no mass. There is no tenderness.  Musculoskeletal: Normal range of motion.  Neurological: She is alert and oriented to person, place, and time.     Lab Results  Component Value Date   HGBA1C 7.6 02/19/2016    Assessment & Plan:   1. Type 2 diabetes mellitus without complication, with long-term current use of insulin (HCC) Not fully optimized with A1c of 7.6 Increase Levemir dose ADA diet and will review blood sugar log at next visit - HgB A1c - Glucose (CBG) - COMPLETE METABOLIC PANEL WITH GFR; Future - Lipid Panel w/reflex Direct LDL; Future - Microalbumin / creatinine urine ratio; Future - insulin aspart (NOVOLOG) 100 UNIT/ML injection; AM - 15 units.  Lunch - 15 units.  Bedtime - 10 units  Dispense: 10 mL; Refill: 5 - metFORMIN (GLUCOPHAGE) 1000 MG tablet; Take 1 tablet (1,000 mg total) by mouth 2 (two) times daily with a meal.  Dispense: 60 tablet; Refill: 5 - insulin detemir (LEVEMIR) 100 UNIT/ML injection; Inject subcutaneously twice daily 32 units in the morning and 30 units in the evening  Dispense: 30 mL; Refill: 11  2. Peptic ulcer disease Stable - omeprazole (PRILOSEC) 40 MG capsule; Take 1 capsule (40 mg total) by mouth every evening.  Dispense: 30 capsule; Refill: 5  3. Lipid disorder We'll send a lipid panel to evaluate - atorvastatin (LIPITOR) 20 MG tablet; Take 1 tablet (20 mg total) by mouth daily.  Dispense: 30 tablet; Refill: 5  4. Essential  hypertension Controlled - lisinopril (PRINIVIL,ZESTRIL) 20 MG tablet; Take 1 tablet (20 mg total) by mouth daily.  Dispense: 30 tablet; Refill: 5  5. History of gastric bypass Patient has been taking vitamin B12 injections for the last 3 years (has also had the medication with her for that duration of time) and has been advised to discontinue. We'll send off levels - Vitamin B12; Future  6. Pedal edema Taking a huge dose of Lasix-80 mg which I have cut to 20 mg daily I have discontinued amlodipine which could also be a culprit for pedal edema.  She would love to have ear irrigation which I have advised her will be done at the next visit.  Meds ordered this encounter  Medications  . atorvastatin (LIPITOR) 20 MG tablet    Sig: Take 1 tablet (20 mg total) by mouth daily.  Dispense:  30 tablet    Refill:  5  . furosemide (LASIX) 20 MG tablet    Sig: Take 4 tablets (80 mg total) by mouth every morning.    Dispense:  30 tablet    Refill:  5  . insulin aspart (NOVOLOG) 100 UNIT/ML injection    Sig: AM - 15 units.  Lunch - 15 units.  Bedtime - 10 units    Dispense:  10 mL    Refill:  5  . metFORMIN (GLUCOPHAGE) 1000 MG tablet    Sig: Take 1 tablet (1,000 mg total) by mouth 2 (two) times daily with a meal.    Dispense:  60 tablet    Refill:  5  . omeprazole (PRILOSEC) 40 MG capsule    Sig: Take 1 capsule (40 mg total) by mouth every evening.    Dispense:  30 capsule    Refill:  5  . lisinopril (PRINIVIL,ZESTRIL) 20 MG tablet    Sig: Take 1 tablet (20 mg total) by mouth daily.    Dispense:  30 tablet    Refill:  5  . insulin detemir (LEVEMIR) 100 UNIT/ML injection    Sig: Inject subcutaneously twice daily 32 units in the morning and 30 units in the evening    Dispense:  30 mL    Refill:  11    Follow-up: Return in about 2 weeks (around 03/04/2016) for coorindation of care.   Jaclyn ShaggyEnobong Amao MD

## 2016-02-19 NOTE — Patient Instructions (Signed)
Diabetes Mellitus and Food It is important for you to manage your blood sugar (glucose) level. Your blood glucose level can be greatly affected by what you eat. Eating healthier foods in the appropriate amounts throughout the day at about the same time each day will help you control your blood glucose level. It can also help slow or prevent worsening of your diabetes mellitus. Healthy eating may even help you improve the level of your blood pressure and reach or maintain a healthy weight. General recommendations for healthful eating and cooking habits include:  Eating meals and snacks regularly. Avoid going long periods of time without eating to lose weight.  Eating a diet that consists mainly of plant-based foods, such as fruits, vegetables, nuts, legumes, and whole grains.  Using low-heat cooking methods, such as baking, instead of high-heat cooking methods, such as deep frying.  Work with your dietitian to make sure you understand how to use the Nutrition Facts information on food labels. How can food affect me? Carbohydrates Carbohydrates affect your blood glucose level more than any other type of food. Your dietitian will help you determine how many carbohydrates to eat at each meal and teach you how to count carbohydrates. Counting carbohydrates is important to keep your blood glucose at a healthy level, especially if you are using insulin or taking certain medicines for diabetes mellitus. Alcohol Alcohol can cause sudden decreases in blood glucose (hypoglycemia), especially if you use insulin or take certain medicines for diabetes mellitus. Hypoglycemia can be a life-threatening condition. Symptoms of hypoglycemia (sleepiness, dizziness, and disorientation) are similar to symptoms of having too much alcohol. If your health care provider has given you approval to drink alcohol, do so in moderation and use the following guidelines:  Women should not have more than one drink per day, and men  should not have more than two drinks per day. One drink is equal to: ? 12 oz of beer. ? 5 oz of wine. ? 1 oz of hard liquor.  Do not drink on an empty stomach.  Keep yourself hydrated. Have water, diet soda, or unsweetened iced tea.  Regular soda, juice, and other mixers might contain a lot of carbohydrates and should be counted.  What foods are not recommended? As you make food choices, it is important to remember that all foods are not the same. Some foods have fewer nutrients per serving than other foods, even though they might have the same number of calories or carbohydrates. It is difficult to get your body what it needs when you eat foods with fewer nutrients. Examples of foods that you should avoid that are high in calories and carbohydrates but low in nutrients include:  Trans fats (most processed foods list trans fats on the Nutrition Facts label).  Regular soda.  Juice.  Candy.  Sweets, such as cake, pie, doughnuts, and cookies.  Fried foods.  What foods can I eat? Eat nutrient-rich foods, which will nourish your body and keep you healthy. The food you should eat also will depend on several factors, including:  The calories you need.  The medicines you take.  Your weight.  Your blood glucose level.  Your blood pressure level.  Your cholesterol level.  You should eat a variety of foods, including:  Protein. ? Lean cuts of meat. ? Proteins low in saturated fats, such as fish, egg whites, and beans. Avoid processed meats.  Fruits and vegetables. ? Fruits and vegetables that may help control blood glucose levels, such as apples,   mangoes, and yams.  Dairy products. ? Choose fat-free or low-fat dairy products, such as milk, yogurt, and cheese.  Grains, bread, pasta, and rice. ? Choose whole grain products, such as multigrain bread, whole oats, and brown rice. These foods may help control blood pressure.  Fats. ? Foods containing healthful fats, such as  nuts, avocado, olive oil, canola oil, and fish.  Does everyone with diabetes mellitus have the same meal plan? Because every person with diabetes mellitus is different, there is not one meal plan that works for everyone. It is very important that you meet with a dietitian who will help you create a meal plan that is just right for you. This information is not intended to replace advice given to you by your health care provider. Make sure you discuss any questions you have with your health care provider. Document Released: 10/21/2004 Document Revised: 07/02/2015 Document Reviewed: 12/21/2012 Elsevier Interactive Patient Education  2017 [employer]  

## 2016-02-22 ENCOUNTER — Ambulatory Visit: Payer: Commercial Managed Care - HMO | Attending: Family Medicine

## 2016-02-22 DIAGNOSIS — Z9889 Other specified postprocedural states: Secondary | ICD-10-CM | POA: Diagnosis not present

## 2016-02-22 DIAGNOSIS — E119 Type 2 diabetes mellitus without complications: Secondary | ICD-10-CM | POA: Insufficient documentation

## 2016-02-22 DIAGNOSIS — Z794 Long term (current) use of insulin: Secondary | ICD-10-CM | POA: Insufficient documentation

## 2016-02-22 DIAGNOSIS — Z9884 Bariatric surgery status: Secondary | ICD-10-CM | POA: Insufficient documentation

## 2016-02-22 DIAGNOSIS — D519 Vitamin B12 deficiency anemia, unspecified: Secondary | ICD-10-CM | POA: Diagnosis not present

## 2016-02-22 LAB — COMPLETE METABOLIC PANEL WITH GFR
ALT: 20 U/L (ref 6–29)
AST: 17 U/L (ref 10–35)
Albumin: 4.3 g/dL (ref 3.6–5.1)
Alkaline Phosphatase: 79 U/L (ref 33–130)
BUN: 8 mg/dL (ref 7–25)
CALCIUM: 9.8 mg/dL (ref 8.6–10.4)
CHLORIDE: 103 mmol/L (ref 98–110)
CO2: 27 mmol/L (ref 20–31)
Creat: 0.7 mg/dL (ref 0.50–1.05)
GFR, Est African American: >89 mL/min (ref >=60)
Glucose, Bld: 157 mg/dL — ABNORMAL HIGH (ref 65–99)
POTASSIUM: 4.1 mmol/L (ref 3.5–5.3)
Sodium: 140 mmol/L (ref 135–146)
Total Bilirubin: 0.6 mg/dL (ref 0.2–1.2)
Total Protein: 7.5 g/dL (ref 6.1–8.1)

## 2016-02-22 LAB — LIPID PANEL W/REFLEX DIRECT LDL
CHOL/HDL RATIO: 1.9 ratio (ref <5.0)
CHOLESTEROL: 107 mg/dL (ref <200)
HDL: 56 mg/dL (ref >50)
LDL-Cholesterol: 38 mg/dL
NON-HDL CHOLESTEROL (CALC): 51 mg/dL (ref <130)
Triglycerides: 57 mg/dL (ref <150)

## 2016-02-22 LAB — VITAMIN B12: Vitamin B-12: 812 pg/mL (ref 200–1100)

## 2016-02-22 NOTE — Progress Notes (Signed)
Patient here for lab visit only 

## 2016-02-23 LAB — MICROALBUMIN / CREATININE URINE RATIO
Creatinine, Urine: 202 mg/dL (ref 20–320)
MICROALB UR: 1.7 mg/dL
MICROALB/CREAT RATIO: 8 ug/mg{creat} (ref <30)

## 2016-03-04 ENCOUNTER — Telehealth: Payer: Self-pay

## 2016-03-04 NOTE — Telephone Encounter (Signed)
Writer called patient and discussed lab results with her.  Patient stated understanding. 

## 2016-03-04 NOTE — Telephone Encounter (Signed)
-----   Message from Jaclyn ShaggyEnobong Amao, MD sent at 02/23/2016  1:23 PM EST ----- Please inform the patient that labs are normal. Thank you.

## 2016-03-07 ENCOUNTER — Ambulatory Visit: Payer: Medicare HMO | Attending: Family Medicine | Admitting: Family Medicine

## 2016-03-07 ENCOUNTER — Encounter: Payer: Self-pay | Admitting: Family Medicine

## 2016-03-07 VITALS — BP 113/67 | HR 72 | Temp 98.4°F | Ht 66.0 in | Wt 193.0 lb

## 2016-03-07 DIAGNOSIS — I10 Essential (primary) hypertension: Secondary | ICD-10-CM | POA: Insufficient documentation

## 2016-03-07 DIAGNOSIS — E0800 Diabetes mellitus due to underlying condition with hyperosmolarity without nonketotic hyperglycemic-hyperosmolar coma (NKHHC): Secondary | ICD-10-CM

## 2016-03-07 DIAGNOSIS — E870 Hyperosmolality and hypernatremia: Secondary | ICD-10-CM | POA: Insufficient documentation

## 2016-03-07 DIAGNOSIS — F419 Anxiety disorder, unspecified: Secondary | ICD-10-CM | POA: Insufficient documentation

## 2016-03-07 DIAGNOSIS — E119 Type 2 diabetes mellitus without complications: Secondary | ICD-10-CM | POA: Diagnosis not present

## 2016-03-07 DIAGNOSIS — Z794 Long term (current) use of insulin: Secondary | ICD-10-CM | POA: Diagnosis not present

## 2016-03-07 DIAGNOSIS — F329 Major depressive disorder, single episode, unspecified: Secondary | ICD-10-CM | POA: Insufficient documentation

## 2016-03-07 DIAGNOSIS — Z8711 Personal history of peptic ulcer disease: Secondary | ICD-10-CM | POA: Diagnosis not present

## 2016-03-07 DIAGNOSIS — Z9884 Bariatric surgery status: Secondary | ICD-10-CM | POA: Diagnosis not present

## 2016-03-07 LAB — GLUCOSE, POCT (MANUAL RESULT ENTRY): POC Glucose: 187 mg/dl — AB (ref 70–99)

## 2016-03-07 MED ORDER — INSULIN ASPART 100 UNIT/ML ~~LOC~~ SOLN: SUBCUTANEOUS | 5 refills | Status: DC

## 2016-03-07 MED ORDER — INSULIN DETEMIR 100 UNIT/ML ~~LOC~~ SOLN: SUBCUTANEOUS | 11 refills | Status: DC

## 2016-03-07 NOTE — Patient Instructions (Signed)
Diabetes Mellitus and Food It is important for you to manage your blood sugar (glucose) level. Your blood glucose level can be greatly affected by what you eat. Eating healthier foods in the appropriate amounts throughout the day at about the same time each day will help you control your blood glucose level. It can also help slow or prevent worsening of your diabetes mellitus. Healthy eating may even help you improve the level of your blood pressure and reach or maintain a healthy weight. General recommendations for healthful eating and cooking habits include:  Eating meals and snacks regularly. Avoid going long periods of time without eating to lose weight.  Eating a diet that consists mainly of plant-based foods, such as fruits, vegetables, nuts, legumes, and whole grains.  Using low-heat cooking methods, such as baking, instead of high-heat cooking methods, such as deep frying.  Work with your dietitian to make sure you understand how to use the Nutrition Facts information on food labels. How can food affect me? Carbohydrates Carbohydrates affect your blood glucose level more than any other type of food. Your dietitian will help you determine how many carbohydrates to eat at each meal and teach you how to count carbohydrates. Counting carbohydrates is important to keep your blood glucose at a healthy level, especially if you are using insulin or taking certain medicines for diabetes mellitus. Alcohol Alcohol can cause sudden decreases in blood glucose (hypoglycemia), especially if you use insulin or take certain medicines for diabetes mellitus. Hypoglycemia can be a life-threatening condition. Symptoms of hypoglycemia (sleepiness, dizziness, and disorientation) are similar to symptoms of having too much alcohol. If your health care provider has given you approval to drink alcohol, do so in moderation and use the following guidelines:  Women should not have more than one drink per day, and men  should not have more than two drinks per day. One drink is equal to: ? 12 oz of beer. ? 5 oz of wine. ? 1 oz of hard liquor.  Do not drink on an empty stomach.  Keep yourself hydrated. Have water, diet soda, or unsweetened iced tea.  Regular soda, juice, and other mixers might contain a lot of carbohydrates and should be counted.  What foods are not recommended? As you make food choices, it is important to remember that all foods are not the same. Some foods have fewer nutrients per serving than other foods, even though they might have the same number of calories or carbohydrates. It is difficult to get your body what it needs when you eat foods with fewer nutrients. Examples of foods that you should avoid that are high in calories and carbohydrates but low in nutrients include:  Trans fats (most processed foods list trans fats on the Nutrition Facts label).  Regular soda.  Juice.  Candy.  Sweets, such as cake, pie, doughnuts, and cookies.  Fried foods.  What foods can I eat? Eat nutrient-rich foods, which will nourish your body and keep you healthy. The food you should eat also will depend on several factors, including:  The calories you need.  The medicines you take.  Your weight.  Your blood glucose level.  Your blood pressure level.  Your cholesterol level.  You should eat a variety of foods, including:  Protein. ? Lean cuts of meat. ? Proteins low in saturated fats, such as fish, egg whites, and beans. Avoid processed meats.  Fruits and vegetables. ? Fruits and vegetables that may help control blood glucose levels, such as apples,   mangoes, and yams.  Dairy products. ? Choose fat-free or low-fat dairy products, such as milk, yogurt, and cheese.  Grains, bread, pasta, and rice. ? Choose whole grain products, such as multigrain bread, whole oats, and brown rice. These foods may help control blood pressure.  Fats. ? Foods containing healthful fats, such as  nuts, avocado, olive oil, canola oil, and fish.  Does everyone with diabetes mellitus have the same meal plan? Because every person with diabetes mellitus is different, there is not one meal plan that works for everyone. It is very important that you meet with a dietitian who will help you create a meal plan that is just right for you. This information is not intended to replace advice given to you by your health care provider. Make sure you discuss any questions you have with your health care provider. Document Released: 10/21/2004 Document Revised: 07/02/2015 Document Reviewed: 12/21/2012 Elsevier Interactive Patient Education  2017 [employer]  

## 2016-03-07 NOTE — Progress Notes (Signed)
Subjective:  Patient ID: Chelsea Rodriguez, female    DOB: 08-23-1960  Age: 56 y.o. MRN: [id number]  CC: Diabetes; Ear Fullness (both); and Hypertension   HPI Chelsea Rodriguez is a 56 year old female with history of type 2 diabetes mellitus (A1c 7.6), hypertension, peptic ulcer disease who presents today for follow-up visit.  Her blood sugar log reveals fasting sugars in the 170s and she informs me she has been taking only 32 units of Levemir in the morning along with 15 units of NovoLog but does not take NovoLog in the afternoon or evening and neither does she take evening Levemir dose due to hypoglycemia around 1-2 AM.  Her dose of Lasix had been decreased from 80 mg to 20 mg at her last office visit and amlodipine was discontinued and she reports no more pedal edema. Denies chest pain or shortness of breath. Does have some occasional ear fullness.  Past Medical History:  Diagnosis Date  . Anxiety   . Depression   . Diabetes mellitus without complication (HCC)   . H/O mammogram 2016  . Hypertension   . Hypertension   . Lipid disorder   . Peptic ulcer disease 1996    Past Surgical History:  Procedure Laterality Date  . ABDOMINAL HYSTERECTOMY  2005  . CHOLECYSTECTOMY  2005  . COLONOSCOPY  2014  . GASTRIC BYPASS  2014    No Known Allergies   Outpatient Medications Prior to Visit  Medication Sig Dispense Refill  . atorvastatin (LIPITOR) 20 MG tablet Take 1 tablet (20 mg total) by mouth daily. 30 tablet 5  . carbamide peroxide (DEBROX) 6.5 % otic solution Place 5 drops into both ears 2 (two) times daily. 15 mL 0  . furosemide (LASIX) 20 MG tablet Take 4 tablets (80 mg total) by mouth every morning. 30 tablet 5  . lisinopril (PRINIVIL,ZESTRIL) 20 MG tablet Take 1 tablet (20 mg total) by mouth daily. 30 tablet 5  . metFORMIN (GLUCOPHAGE) 1000 MG tablet Take 1 tablet (1,000 mg total) by mouth 2 (two) times daily with a meal. 60 tablet 5  . omeprazole (PRILOSEC) 40 MG capsule Take  1 capsule (40 mg total) by mouth every evening. 30 capsule 5  . insulin aspart (NOVOLOG) 100 UNIT/ML injection AM - 15 units.  Lunch - 15 units.  Bedtime - 10 units 10 mL 5  . insulin detemir (LEVEMIR) 100 UNIT/ML injection Inject subcutaneously twice daily 32 units in the morning and 30 units in the evening 30 mL 11  . cyclobenzaprine (FLEXERIL) 10 MG tablet Take 1 tablet (10 mg total) by mouth 2 (two) times daily as needed for muscle spasms. (Patient not taking: Reported on 02/19/2016) 20 tablet 0   No facility-administered medications prior to visit.     ROS Review of Systems  Constitutional: Negative for activity change and appetite change.  HENT: Negative for sinus pressure and sore throat.   Respiratory: Negative for chest tightness, shortness of breath and wheezing.   Cardiovascular: Negative for chest pain and palpitations.  Gastrointestinal: Negative for abdominal distention, abdominal pain and constipation.  Genitourinary: Negative.   Musculoskeletal: Negative.   Psychiatric/Behavioral: Negative for behavioral problems and dysphoric mood.    Objective:  BP 113/67 (BP Location: Right Arm, Patient Position: Sitting, Cuff Size: Small)   Pulse 72   Temp 98.4 F (36.9 C) (Oral)   Ht 5\' 6"  (1.676 m)   Wt 193 lb (87.5 kg)   SpO2 100%   BMI 31.15 kg/m   BP/Weight 03/07/2016  02/19/2016 02/02/2016  Systolic BP 113 131 112  Diastolic BP 67 72 63  Wt. (Lbs) 193 190.2 -  BMI 31.15 30.7 -      Physical Exam  Constitutional: She is oriented to person, place, and time. She appears well-developed and well-nourished.  Cardiovascular: Normal rate, normal heart sounds and intact distal pulses.   No murmur heard. Pulmonary/Chest: Effort normal and breath sounds normal. She has no wheezes. She has no rales. She exhibits no tenderness.  Abdominal: Soft. Bowel sounds are normal. She exhibits no distension and no mass. There is no tenderness.  Musculoskeletal: Normal range of motion.    Neurological: She is alert and oriented to person, place, and time.     Lab Results  Component Value Date   HGBA1C 7.6 02/19/2016    Assessment & Plan:   1. Diabetes mellitus due to underlying condition with hyperosmolarity without coma, without long-term current use of insulin (HCC) A1c 7.6 - Glucose (CBG)  2. Type 2 diabetes mellitus without complication, with long-term current use of insulin (HCC) Fasting sugars are in the 170s Advised to commence 10 units at bedtime; continue 32 units in the morning Placed on Novolog sliding scale Patient to reduce Levemir by 2 units in the event of hypoglycemia - insulin detemir (LEVEMIR) 100 UNIT/ML injection; Inject subcutaneously twice daily 32 units in the morning and 10 units in the evening  Dispense: 30 mL; Refill: 11 - insulin aspart (NOVOLOG) 100 UNIT/ML injection; 0-12 units as per sliding scale  Dispense: 10 mL; Refill: 5  3. Essential hypertension Controlled   Meds ordered this encounter  Medications  . insulin detemir (LEVEMIR) 100 UNIT/ML injection    Sig: Inject subcutaneously twice daily 32 units in the morning and 10 units in the evening    Dispense:  30 mL    Refill:  11  . insulin aspart (NOVOLOG) 100 UNIT/ML injection    Sig: 0-12 units as per sliding scale    Dispense:  10 mL    Refill:  5    Follow-up: Return in about 1 month (around 04/06/2016) for follow up on Diabetes mellitus.   Jaclyn Shaggy MD

## 2016-03-14 ENCOUNTER — Telehealth: Payer: Self-pay | Admitting: Family Medicine

## 2016-03-14 NOTE — Telephone Encounter (Signed)
Pt called states she still has not received muscle relaxer medication , please f/up

## 2016-03-15 ENCOUNTER — Other Ambulatory Visit: Payer: Self-pay | Admitting: Family Medicine

## 2016-03-15 MED ORDER — CYCLOBENZAPRINE HCL 10 MG PO TABS: 10.0000 mg | ORAL_TABLET | Freq: Two times a day (BID) | ORAL | 1 refills | Status: DC | PRN

## 2016-03-15 NOTE — Telephone Encounter (Signed)
Writer called patient. She is aware that MD sent a prescription to her mail order pharmacy.

## 2016-03-15 NOTE — Telephone Encounter (Signed)
Done

## 2016-04-01 ENCOUNTER — Other Ambulatory Visit: Payer: Self-pay | Admitting: Family Medicine

## 2016-04-04 ENCOUNTER — Ambulatory Visit: Payer: Medicare HMO | Attending: Family Medicine | Admitting: Family Medicine

## 2016-04-04 ENCOUNTER — Encounter: Payer: Self-pay | Admitting: Family Medicine

## 2016-04-04 VITALS — BP 111/67 | HR 89 | Temp 98.5°F | Ht 66.0 in | Wt 188.4 lb

## 2016-04-04 DIAGNOSIS — Z23 Encounter for immunization: Secondary | ICD-10-CM

## 2016-04-04 DIAGNOSIS — I10 Essential (primary) hypertension: Secondary | ICD-10-CM | POA: Diagnosis not present

## 2016-04-04 DIAGNOSIS — F419 Anxiety disorder, unspecified: Secondary | ICD-10-CM | POA: Diagnosis not present

## 2016-04-04 DIAGNOSIS — E119 Type 2 diabetes mellitus without complications: Secondary | ICD-10-CM

## 2016-04-04 DIAGNOSIS — Z794 Long term (current) use of insulin: Secondary | ICD-10-CM | POA: Insufficient documentation

## 2016-04-04 DIAGNOSIS — Z1231 Encounter for screening mammogram for malignant neoplasm of breast: Secondary | ICD-10-CM

## 2016-04-04 DIAGNOSIS — F329 Major depressive disorder, single episode, unspecified: Secondary | ICD-10-CM | POA: Insufficient documentation

## 2016-04-04 DIAGNOSIS — Z1239 Encounter for other screening for malignant neoplasm of breast: Secondary | ICD-10-CM

## 2016-04-04 DIAGNOSIS — Z8711 Personal history of peptic ulcer disease: Secondary | ICD-10-CM | POA: Insufficient documentation

## 2016-04-04 DIAGNOSIS — Z90710 Acquired absence of both cervix and uterus: Secondary | ICD-10-CM | POA: Diagnosis not present

## 2016-04-04 DIAGNOSIS — E1100 Type 2 diabetes mellitus with hyperosmolarity without nonketotic hyperglycemic-hyperosmolar coma (NKHHC): Secondary | ICD-10-CM | POA: Insufficient documentation

## 2016-04-04 DIAGNOSIS — Z9884 Bariatric surgery status: Secondary | ICD-10-CM | POA: Diagnosis not present

## 2016-04-04 DIAGNOSIS — K279 Peptic ulcer, site unspecified, unspecified as acute or chronic, without hemorrhage or perforation: Secondary | ICD-10-CM

## 2016-04-04 DIAGNOSIS — E789 Disorder of lipoprotein metabolism, unspecified: Secondary | ICD-10-CM | POA: Diagnosis not present

## 2016-04-04 DIAGNOSIS — R0982 Postnasal drip: Secondary | ICD-10-CM | POA: Insufficient documentation

## 2016-04-04 LAB — GLUCOSE, POCT (MANUAL RESULT ENTRY): POC GLUCOSE: 109 mg/dL — AB (ref 70–99)

## 2016-04-04 MED ORDER — FUROSEMIDE 20 MG PO TABS: 20.0000 mg | ORAL_TABLET | Freq: Every morning | ORAL | 1 refills | Status: DC

## 2016-04-04 MED ORDER — INSULIN DETEMIR 100 UNIT/ML ~~LOC~~ SOLN: SUBCUTANEOUS | 11 refills | Status: DC

## 2016-04-04 MED ORDER — LISINOPRIL 20 MG PO TABS: 20.0000 mg | ORAL_TABLET | Freq: Every day | ORAL | 1 refills | Status: DC

## 2016-04-04 MED ORDER — PNEUMOCOCCAL VAC POLYVALENT 25 MCG/0.5ML IJ INJ
0.5000 mL | INJECTION | INTRAMUSCULAR | Status: AC
  Administered 2016-04-04: 0.5 mL via INTRAMUSCULAR

## 2016-04-04 MED ORDER — ATORVASTATIN CALCIUM 20 MG PO TABS: 20.0000 mg | ORAL_TABLET | Freq: Every day | ORAL | 1 refills | Status: DC

## 2016-04-04 MED ORDER — OMEPRAZOLE 40 MG PO CPDR: 40.0000 mg | DELAYED_RELEASE_CAPSULE | Freq: Every evening | ORAL | 1 refills | Status: DC

## 2016-04-04 MED ORDER — METFORMIN HCL 1000 MG PO TABS: 1000.0000 mg | ORAL_TABLET | Freq: Two times a day (BID) | ORAL | 1 refills | Status: DC

## 2016-04-04 MED ORDER — CYCLOBENZAPRINE HCL 10 MG PO TABS: 10.0000 mg | ORAL_TABLET | Freq: Two times a day (BID) | ORAL | 1 refills | Status: DC | PRN

## 2016-04-04 MED ORDER — INSULIN ASPART 100 UNIT/ML ~~LOC~~ SOLN: SUBCUTANEOUS | 1 refills | Status: DC

## 2016-04-04 MED ORDER — CETIRIZINE HCL 10 MG PO TABS: 10.0000 mg | ORAL_TABLET | Freq: Every day | ORAL | 1 refills | Status: DC

## 2016-04-04 NOTE — Progress Notes (Signed)
[facility]

## 2016-04-04 NOTE — Progress Notes (Signed)
Subjective:    Patient ID: Chelsea Rodriguez, female    DOB: January 15, 1961, 56 y.o.   MRN: [id number]  HPI 56 year old female with a history of type 2 diabetes mellitus (A1c 7.6), hypertension, dyslipidemia, peptic ulcer disease who comes in for follow-up of her diabetes mellitus.  She currently takes 32 units of Levemir in the morning and 10 units at night and blood sugar log reveals fasting sugars in the 70-90 range and random sugars of 190 03/11/2018. She also remains on NovoLog sliding scale. She is concerned that she does have night time hypoglycemia based on the fact that her morning sugars go even though she does not have numbers to prove this. She takes her morning insulin at 7 AM and evening at 4 PM. Denies hypoglycemic symptoms.  She has been compliant with her antihypertensives, statin and other medications.  Complaint of postnasal drip and feeling of mucus at the back of her throat every morning. No sinus tenderness, no fevers, no rhinorrhea.  Past Medical History:  Diagnosis Date  . Anxiety   . Depression   . Diabetes mellitus without complication (HCC)   . H/O mammogram 2016  . Hypertension   . Hypertension   . Lipid disorder   . Peptic ulcer disease 1996    Past Surgical History:  Procedure Laterality Date  . ABDOMINAL HYSTERECTOMY  2005  . CHOLECYSTECTOMY  2005  . COLONOSCOPY  2014  . GASTRIC BYPASS  2014    No Known Allergies  Outpatient Encounter Prescriptions as of 04/04/2016  Medication Sig  . atorvastatin (LIPITOR) 20 MG tablet Take 1 tablet (20 mg total) by mouth daily.  . carbamide peroxide (DEBROX) 6.5 % otic solution Place 5 drops into both ears 2 (two) times daily.  . cyclobenzaprine (FLEXERIL) 10 MG tablet Take 1 tablet (10 mg total) by mouth 2 (two) times daily as needed for muscle spasms.  . furosemide (LASIX) 20 MG tablet Take 1 tablet (20 mg total) by mouth every morning.  . insulin aspart (NOVOLOG) 100 UNIT/ML injection 0-12 units as per sliding  scale  . insulin detemir (LEVEMIR) 100 UNIT/ML injection Inject subcutaneously twice daily 32 units in the morning and 10 units in the evening  . lisinopril (PRINIVIL,ZESTRIL) 20 MG tablet Take 1 tablet (20 mg total) by mouth daily.  . metFORMIN (GLUCOPHAGE) 1000 MG tablet Take 1 tablet (1,000 mg total) by mouth 2 (two) times daily with a meal.  . omeprazole (PRILOSEC) 40 MG capsule Take 1 capsule (40 mg total) by mouth every evening.  . [DISCONTINUED] atorvastatin (LIPITOR) 20 MG tablet Take 1 tablet (20 mg total) by mouth daily.  . [DISCONTINUED] cyclobenzaprine (FLEXERIL) 10 MG tablet Take 1 tablet (10 mg total) by mouth 2 (two) times daily as needed for muscle spasms.  . [DISCONTINUED] furosemide (LASIX) 20 MG tablet Take 4 tablets (80 mg total) by mouth every morning.  . [DISCONTINUED] insulin aspart (NOVOLOG) 100 UNIT/ML injection 0-12 units as per sliding scale  . [DISCONTINUED] insulin detemir (LEVEMIR) 100 UNIT/ML injection Inject subcutaneously twice daily 32 units in the morning and 10 units in the evening  . [DISCONTINUED] lisinopril (PRINIVIL,ZESTRIL) 20 MG tablet Take 1 tablet (20 mg total) by mouth daily.  . [DISCONTINUED] metFORMIN (GLUCOPHAGE) 1000 MG tablet Take 1 tablet (1,000 mg total) by mouth 2 (two) times daily with a meal.  . [DISCONTINUED] omeprazole (PRILOSEC) 40 MG capsule Take 1 capsule (40 mg total) by mouth every evening.  . cetirizine (ZYRTEC) 10 MG tablet Take 1  tablet (10 mg total) by mouth daily.   No facility-administered encounter medications on file as of 04/04/2016.       Review of Systems  Constitutional: Negative for activity change, appetite change and fatigue.  HENT: Positive for postnasal drip. Negative for congestion, sinus pressure and sore throat.   Eyes: Negative for visual disturbance.  Respiratory: Negative for cough, chest tightness, shortness of breath and wheezing.   Cardiovascular: Negative for chest pain and palpitations.    Gastrointestinal: Negative for abdominal distention, abdominal pain and constipation.  Endocrine: Negative for polydipsia.  Genitourinary: Negative for dysuria and frequency.  Musculoskeletal: Negative for arthralgias and back pain.  Skin: Negative for rash.  Neurological: Negative for tremors, light-headedness and numbness.  Hematological: Does not bruise/bleed easily.  Psychiatric/Behavioral: Negative for agitation and behavioral problems.       Objective: Vitals:   04/04/16 0853  BP: 111/67  Pulse: 89  Temp: 98.5 F (36.9 C)  TempSrc: Oral  SpO2: 100%  Weight: 188 lb 6.4 oz (85.5 kg)  Height: 5\' 6"  (1.676 m)      Physical Exam Constitutional: She is oriented to person, place, and time. She appears well-developed and well-nourished.  HEENT: Normal oropharynx, normal TMs bilaterally Cardiovascular: Normal rate, normal heart sounds and intact distal pulses.   No murmur heard. Pulmonary/Chest: Effort normal and breath sounds normal. She has no wheezes. She has no rales. She exhibits no tenderness.  Abdominal: Soft. Bowel sounds are normal. She exhibits no distension and no mass. There is no tenderness.  Musculoskeletal: Normal range of motion.  Neurological: She is alert and oriented to person, place, and time.  Psych: Normal   Lab Results  Component Value Date   HGBA1C 7.6 02/19/2016    CMP Latest Ref Rng & Units 02/22/2016 05/18/2015  Glucose 65 - 99 mg/dL 409(W157(H) 119(J110(H)  BUN 7 - 25 mg/dL 8 11  Creatinine 4.780.50 - 1.05 mg/dL 2.950.70 6.210.70  Sodium 308135 - 146 mmol/L 140 143  Potassium 3.5 - 5.3 mmol/L 4.1 3.9  Chloride 98 - 110 mmol/L 103 110  CO2 20 - 31 mmol/L 27 23  Calcium 8.6 - 10.4 mg/dL 9.8 6.5(H8.8(L)  Total Protein 6.1 - 8.1 g/dL 7.5 6.3(L)  Total Bilirubin 0.2 - 1.2 mg/dL 0.6 0.4  Alkaline Phos 33 - 130 U/L 79 54  AST 10 - 35 U/L 17 31  ALT 6 - 29 U/L 20 25    Lipid Panel     Component Value Date/Time   CHOL 107 02/22/2016 0841   TRIG 57 02/22/2016 0841   HDL  56 02/22/2016 0841   CHOLHDL 1.9 02/22/2016 0841        Assessment & Plan:  1. Diabetes mellitus due to underlying condition with hyperosmolarity without coma, without long-term current use of insulin (HCC) Pneumovax today A1c 7.6 - Glucose (CBG)  2. Type 2 diabetes mellitus without complication, with long-term current use of insulin (HCC) Fasting sugars are in the 70s Advised to continue 10 units at bedtime; continue 32 units in the morning; desist from administering nightime dose by 4pm and give 10 - 12 hours in between doses. Copntinue Novolog sliding scale Patient to reduce Levemir by 2 units in the event of hypoglycemia - insulin detemir (LEVEMIR) 100 UNIT/ML injection; Inject subcutaneously twice daily 32 units in the morning and 10 units in the evening  Dispense: 30 mL; Refill: 11 - insulin aspart (NOVOLOG) 100 UNIT/ML injection; 0-12 units as per sliding scale  Dispense: 10 mL; Refill: 5  3. Essential hypertension Controlled  4.Screening for breast cancer - mammogram ordered  5. Postnasal drip Placed on Zyrtec  6. HCM Up-to-date on colonoscopy as patient Status post hysterectomy, no Pap smear indicated

## 2016-04-05 ENCOUNTER — Other Ambulatory Visit: Payer: Self-pay | Admitting: Family Medicine

## 2016-04-05 DIAGNOSIS — Z1231 Encounter for screening mammogram for malignant neoplasm of breast: Secondary | ICD-10-CM

## 2016-04-25 ENCOUNTER — Ambulatory Visit
Admission: RE | Admit: 2016-04-25 | Discharge: 2016-04-25 | Disposition: A | Discharge status: Home / Self Care | Payer: Medicare HMO | Source: Ambulatory Visit | Attending: Family Medicine | Admitting: Family Medicine

## 2016-04-25 DIAGNOSIS — Z1231 Encounter for screening mammogram for malignant neoplasm of breast: Secondary | ICD-10-CM

## 2016-04-27 DIAGNOSIS — E119 Type 2 diabetes mellitus without complications: Secondary | ICD-10-CM | POA: Diagnosis not present

## 2016-04-27 DIAGNOSIS — H25813 Combined forms of age-related cataract, bilateral: Secondary | ICD-10-CM | POA: Diagnosis not present

## 2016-04-27 DIAGNOSIS — H40053 Ocular hypertension, bilateral: Secondary | ICD-10-CM | POA: Diagnosis not present

## 2016-04-28 ENCOUNTER — Other Ambulatory Visit: Payer: Self-pay | Admitting: Pharmacist

## 2016-05-05 ENCOUNTER — Telehealth: Payer: Self-pay | Admitting: Family Medicine

## 2016-05-05 NOTE — Telephone Encounter (Signed)
Patient called the office asking to speak with nurse regarding her medication. Pt stated that medication list including her bp medication needs to [facility] Please follow up.  Thank you.

## 2016-05-05 NOTE — Telephone Encounter (Signed)
Writer called patient who is requesting that an updated medication list get faxed to [facility].  Writer faxed the list and [facility] the updated list.

## 2016-05-10 ENCOUNTER — Telehealth: Payer: Self-pay

## 2016-05-10 NOTE — Telephone Encounter (Signed)
-----   Message from Jaclyn Shaggy, MD sent at 04/29/2016 11:10 AM EDT ----- Mammogram is negative for malignancy

## 2016-05-10 NOTE — Telephone Encounter (Signed)
Writer called patient and LVM on her personal phone informing her that her test result was normal.  Patient was encouraged to call back with any questions.

## 2016-06-06 ENCOUNTER — Ambulatory Visit: Payer: Medicare HMO | Attending: Family Medicine | Admitting: Family Medicine

## 2016-06-06 ENCOUNTER — Encounter: Payer: Self-pay | Admitting: Family Medicine

## 2016-06-06 VITALS — BP 125/78 | HR 69 | Temp 98.3°F | Ht 66.0 in | Wt 188.8 lb

## 2016-06-06 DIAGNOSIS — Z1211 Encounter for screening for malignant neoplasm of colon: Secondary | ICD-10-CM | POA: Diagnosis not present

## 2016-06-06 DIAGNOSIS — Z9049 Acquired absence of other specified parts of digestive tract: Secondary | ICD-10-CM | POA: Diagnosis not present

## 2016-06-06 DIAGNOSIS — I10 Essential (primary) hypertension: Secondary | ICD-10-CM | POA: Diagnosis not present

## 2016-06-06 DIAGNOSIS — F419 Anxiety disorder, unspecified: Secondary | ICD-10-CM | POA: Insufficient documentation

## 2016-06-06 DIAGNOSIS — E119 Type 2 diabetes mellitus without complications: Secondary | ICD-10-CM | POA: Diagnosis not present

## 2016-06-06 DIAGNOSIS — E789 Disorder of lipoprotein metabolism, unspecified: Secondary | ICD-10-CM | POA: Diagnosis not present

## 2016-06-06 DIAGNOSIS — F329 Major depressive disorder, single episode, unspecified: Secondary | ICD-10-CM | POA: Insufficient documentation

## 2016-06-06 DIAGNOSIS — E785 Hyperlipidemia, unspecified: Secondary | ICD-10-CM | POA: Insufficient documentation

## 2016-06-06 DIAGNOSIS — Z794 Long term (current) use of insulin: Secondary | ICD-10-CM

## 2016-06-06 DIAGNOSIS — K279 Peptic ulcer, site unspecified, unspecified as acute or chronic, without hemorrhage or perforation: Secondary | ICD-10-CM

## 2016-06-06 LAB — GLUCOSE, POCT (MANUAL RESULT ENTRY): POC Glucose: 115 mg/dl — AB (ref 70–99)

## 2016-06-06 LAB — POCT GLYCOSYLATED HEMOGLOBIN (HGB A1C): HEMOGLOBIN A1C: 6.5

## 2016-06-06 MED ORDER — ACCU-CHEK SOFTCLIX LANCET DEV MISC: 5 refills | Status: DC

## 2016-06-06 MED ORDER — GLUCOSE BLOOD VI STRP: ORAL_STRIP | 12 refills | Status: DC

## 2016-06-06 MED ORDER — INSULIN DETEMIR 100 UNIT/ML ~~LOC~~ SOLN: SUBCUTANEOUS | 11 refills | Status: DC

## 2016-06-06 MED ORDER — INSULIN PEN NEEDLE 31G X 5 MM MISC: 1.0000 | Freq: Every day | 5 refills | Status: DC

## 2016-06-06 MED ORDER — LISINOPRIL 10 MG PO TABS: 10.0000 mg | ORAL_TABLET | Freq: Every day | ORAL | 1 refills | Status: DC

## 2016-06-06 NOTE — Patient Instructions (Signed)

## 2016-06-06 NOTE — Progress Notes (Signed)
Subjective:  Patient ID: Chelsea Rodriguez, female    DOB: 1960-08-01  Age: 56 y.o. MRN: [id number]  CC: Diabetes and Hypertension   HPI Chelsea Rodriguez 56 year old female with a history of type 2 diabetes mellitus (A1c 6.5 which is down from 7.6), hypertension, dyslipidemia, peptic ulcer disease who comes in for follow-up of her diabetes mellitus.  She currently takes 32 units of Levemir in the morning and sometimes skips her evening dose of Levemir due to hypoglycemia. She has to skip doses up to 4 times a week ;review of her blood sugar log indicates a fasting sugars in the 90-110 range and evening sugars between 80 and 140 She also has Novolog sliding scale she rarely has to use.  She has been compliant with a diabetic diet, low sodium diet and has been taking her antihypertensive however she has been out of her statin. She has no acute concerns today.   Past Medical History:  Diagnosis Date  . Anxiety   . Depression   . Diabetes mellitus without complication (HCC)   . H/O mammogram 2016  . Hypertension   . Hypertension   . Lipid disorder   . Peptic ulcer disease 1996    Past Surgical History:  Procedure Laterality Date  . ABDOMINAL HYSTERECTOMY  2005  . CHOLECYSTECTOMY  2005  . COLONOSCOPY  2014  . GASTRIC BYPASS  2014    No Known Allergies   Outpatient Medications Prior to Visit  Medication Sig Dispense Refill  . atorvastatin (LIPITOR) 20 MG tablet Take 1 tablet (20 mg total) by mouth daily. 90 tablet 1  . carbamide peroxide (DEBROX) 6.5 % otic solution Place 5 drops into both ears 2 (two) times daily. 15 mL 0  . cyclobenzaprine (FLEXERIL) 10 MG tablet Take 1 tablet (10 mg total) by mouth 2 (two) times daily as needed for muscle spasms. 90 tablet 1  . furosemide (LASIX) 20 MG tablet Take 1 tablet (20 mg total) by mouth every morning. 90 tablet 1  . insulin aspart (NOVOLOG) 100 UNIT/ML injection 0-12 units as per sliding scale 30 mL 1  . metFORMIN (GLUCOPHAGE) 1000  MG tablet Take 1 tablet (1,000 mg total) by mouth 2 (two) times daily with a meal. 180 tablet 1  . omeprazole (PRILOSEC) 40 MG capsule Take 1 capsule (40 mg total) by mouth every evening. 90 capsule 1  . insulin detemir (LEVEMIR) 100 UNIT/ML injection Inject subcutaneously twice daily 32 units in the morning and 10 units in the evening 30 mL 11  . cetirizine (ZYRTEC) 10 MG tablet Take 1 tablet (10 mg total) by mouth daily. (Patient not taking: Reported on 06/06/2016) 90 tablet 1  . lisinopril (PRINIVIL,ZESTRIL) 20 MG tablet Take 1 tablet (20 mg total) by mouth daily. (Patient not taking: Reported on 06/06/2016) 90 tablet 1   No facility-administered medications prior to visit.     ROS Review of Systems  Constitutional: Negative for activity change, appetite change and fatigue.  HENT: Negative for congestion, sinus pressure and sore throat.   Eyes: Negative for visual disturbance.  Respiratory: Negative for cough, chest tightness, shortness of breath and wheezing.   Cardiovascular: Negative for chest pain and palpitations.  Gastrointestinal: Negative for abdominal distention, abdominal pain and constipation.  Endocrine: Negative for polydipsia.  Genitourinary: Negative for dysuria and frequency.  Musculoskeletal: Negative for arthralgias and back pain.  Skin: Negative for rash.  Neurological: Negative for tremors, light-headedness and numbness.  Hematological: Does not bruise/bleed easily.  Psychiatric/Behavioral: Negative for agitation  and behavioral problems.    Objective:  BP 125/78 (BP Location: Right Arm, Patient Position: Sitting, Cuff Size: Small)   Pulse 69   Temp 98.3 F (36.8 C) (Oral)   Ht  (1.676 m)   Wt 188 lb 12.8 oz (85.6 kg)   SpO2 92%   BMI 30.47 kg/m   BP/Weight 06/06/2016 04/04/2016 03/07/2016  Systolic BP 125 111 113  Diastolic BP 78 67 67  Wt. (Lbs) 188.8 188.4 193  BMI 30.47 30.41 31.15      Physical Exam  Constitutional: She is oriented to person,  place, and time. She appears well-developed and well-nourished.  Cardiovascular: Normal rate, normal heart sounds and intact distal pulses.   No murmur heard. Pulmonary/Chest: Effort normal and breath sounds normal. She has no wheezes. She has no rales. She exhibits no tenderness.  Abdominal: Soft. Bowel sounds are normal. She exhibits no distension and no mass. There is no tenderness.  Musculoskeletal: Normal range of motion.  Neurological: She is alert and oriented to person, place, and time.  Skin: Skin is warm and dry.  Psychiatric: She has a normal mood and affect.    Lab Results  Component Value Date   HGBA1C 6.5 06/06/2016    CMP Latest Ref Rng & Units 02/22/2016 05/18/2015  Glucose 65 - 99 mg/dL 161(W) 960(A)  BUN 7 - 25 mg/dL 8 11  Creatinine 5.40 - 1.05 mg/dL 9.81 1.91  Sodium 478 - 146 mmol/L 140 143  Potassium 3.5 - 5.3 mmol/L 4.1 3.9  Chloride 98 - 110 mmol/L 103 110  CO2 20 - 31 mmol/L 27 23  Calcium 8.6 - 10.4 mg/dL 9.8 2.9(F)  Total Protein 6.1 - 8.1 g/dL 7.5 6.3(L)  Total Bilirubin 0.2 - 1.2 mg/dL 0.6 0.4  Alkaline Phos 33 - 130 U/L 79 54  AST 10 - 35 U/L 17 31  ALT 6 - 29 U/L 20 25    Lipid Panel     Component Value Date/Time   CHOL 107 02/22/2016 0841   TRIG 57 02/22/2016 0841   HDL 56 02/22/2016 0841   CHOLHDL 1.9 02/22/2016 0841    Assessment & Plan:   1. Essential hypertension Controlled Reduced dose of lisinopril from 20 mg to 10 mg of blood pressure is controlled off  lisinopril - lisinopril (PRINIVIL,ZESTRIL) 10 MG tablet; Take 1 tablet (10 mg total) by mouth daily.  Dispense: 90 tablet; Refill: 1  2. Diabetes mellitus without complication (HCC) Controlled with A1c of 6.5 which has improved from 7.6 previously Reduced dose of Lantus to 25 units twice daily as she has had previous hypoglycemic episodes Continue NovoLog sliding scale - Glucose (CBG) - HgB A1c - insulin detemir (LEVEMIR) 100 UNIT/ML injection; Inject subcutaneously twice daily  25 units in the morning and evening  Dispense: 30 mL; Refill: 11 - Insulin Pen Needle 31G X 5 MM MISC; 1 each by Does not apply route at bedtime.  Dispense: 30 each; Refill: 5 - Lancet Devices (ACCU-CHEK SOFTCLIX) lancets; Use 3 times daily before meals  Dispense: 1 each; Refill: 5 - glucose blood (ACCU-CHEK AVIVA) test strip; Use 3 times daily before meals  Dispense: 100 each; Refill: 12  3. Lipid disorder Controlled Continue statin  4. Peptic ulcer disease Stable  5. Type 2 diabetes mellitus without complication, with long-term current use of insulin (HCC)  6. Screening for colon cancer - Ambulatory referral to Gastroenterology   Meds ordered this encounter  Medications  . lisinopril (PRINIVIL,ZESTRIL) 10 MG tablet  Sig: Take 1 tablet (10 mg total) by mouth daily.    Dispense:  90 tablet    Refill:  1    Discontinue previous dose  . insulin detemir (LEVEMIR) 100 UNIT/ML injection    Sig: Inject subcutaneously twice daily 25 units in the morning and evening    Dispense:  30 mL    Refill:  11  . Insulin Pen Needle 31G X 5 MM MISC    Sig: 1 each by Does not apply route at bedtime.    Dispense:  30 each    Refill:  5  . Lancet Devices (ACCU-CHEK SOFTCLIX) lancets    Sig: Use 3 times daily before meals    Dispense:  1 each    Refill:  5  . glucose blood (ACCU-CHEK AVIVA) test strip    Sig: Use 3 times daily before meals    Dispense:  100 each    Refill:  12    Follow-up: Return in about 3 months (around 09/05/2016) for Follow-up on diabetes mellitus.   Jaclyn Shaggy MD

## 2016-06-06 NOTE — Progress Notes (Signed)
Been off of BP meds for one month

## 2016-06-18 ENCOUNTER — Encounter (HOSPITAL_COMMUNITY): Payer: Self-pay | Admitting: Emergency Medicine

## 2016-06-18 ENCOUNTER — Emergency Department (HOSPITAL_COMMUNITY)
Admission: EM | Admit: 2016-06-18 | Discharge: 2016-06-18 | Disposition: A | Discharge status: Home / Self Care | Payer: Medicare HMO | Attending: Emergency Medicine | Admitting: Emergency Medicine

## 2016-06-18 DIAGNOSIS — M549 Dorsalgia, unspecified: Secondary | ICD-10-CM | POA: Diagnosis present

## 2016-06-18 DIAGNOSIS — Z794 Long term (current) use of insulin: Secondary | ICD-10-CM | POA: Diagnosis not present

## 2016-06-18 DIAGNOSIS — M545 Low back pain, unspecified: Secondary | ICD-10-CM

## 2016-06-18 DIAGNOSIS — I10 Essential (primary) hypertension: Secondary | ICD-10-CM | POA: Insufficient documentation

## 2016-06-18 DIAGNOSIS — E119 Type 2 diabetes mellitus without complications: Secondary | ICD-10-CM | POA: Diagnosis not present

## 2016-06-18 DIAGNOSIS — R109 Unspecified abdominal pain: Secondary | ICD-10-CM | POA: Diagnosis not present

## 2016-06-18 DIAGNOSIS — Z79899 Other long term (current) drug therapy: Secondary | ICD-10-CM | POA: Diagnosis not present

## 2016-06-18 LAB — I-STAT CHEM 8, ED
BUN: 11 mg/dL (ref 6–20)
CALCIUM ION: 1.19 mmol/L (ref 1.15–1.40)
CREATININE: 1 mg/dL (ref 0.44–1.00)
Chloride: 110 mmol/L (ref 101–111)
GLUCOSE: 100 mg/dL — AB (ref 65–99)
HEMATOCRIT: 30 % — AB (ref 36.0–46.0)
Hemoglobin: 10.2 g/dL — ABNORMAL LOW (ref 12.0–15.0)
POTASSIUM: 4.2 mmol/L (ref 3.5–5.1)
SODIUM: 143 mmol/L (ref 135–145)
TCO2: 23 mmol/L (ref 0–100)

## 2016-06-18 LAB — URINALYSIS, ROUTINE W REFLEX MICROSCOPIC
Bilirubin Urine: NEGATIVE
Glucose, UA: NEGATIVE mg/dL
Hgb urine dipstick: NEGATIVE
Ketones, ur: NEGATIVE mg/dL
NITRITE: NEGATIVE
Protein, ur: NEGATIVE mg/dL
SPECIFIC GRAVITY, URINE: 1.015 (ref 1.005–1.030)
pH: 5.5 (ref 5.0–8.0)

## 2016-06-18 LAB — URINALYSIS, MICROSCOPIC (REFLEX): RBC / HPF: NONE SEEN RBC/hpf (ref 0–5)

## 2016-06-18 MED ORDER — OXYCODONE HCL 5 MG PO TABS
5.0000 mg | ORAL_TABLET | Freq: Once | ORAL | Status: AC
  Administered 2016-06-18: 5 mg via ORAL
  Filled 2016-06-18: qty 1

## 2016-06-18 MED ORDER — NAPROXEN 250 MG PO TABS: 250.0000 mg | ORAL_TABLET | Freq: Two times a day (BID) | ORAL | 0 refills | Status: DC

## 2016-06-18 NOTE — ED Notes (Signed)
ED Provider at bedside. 

## 2016-06-18 NOTE — ED Provider Notes (Signed)
[facility]-EMERGENCY DEPT Provider Note   CSN: [id number] Arrival date & time: 06/18/16  0800     History   Chief Complaint Chief Complaint  Patient presents with  . Flank Pain    HPI Chelsea Rodriguez is a 56 y.o. female.  Chelsea Rodriguez is a 56 y.o. Female who presents to the emergency department complaining of right back/flank pain ongoing for the past week. She reports this is been getting worse. She tells me she believes she has kidney infection. She denies any urinary symptoms. She reports her pain is in her right low back and is worse with movement. She took 2 Tylenol around 6 AM this morning with little relief. She reports her pain is constant and is worse with movement and walking. No recent urinary tract infections or history of pyelonephritis. She denies injury or trauma to her back. She denies history of IV drug use or cancer. She denies loss of bowel or bladder control. She denies fevers, numbness, tingling, weakness, abdominal pain, nausea, vomiting, diarrhea, dysuria, hematuria, urinary frequency, urinary urgency, or rashes. Previous abdominal surgery includes a hysterectomy.   The history is provided by the patient and medical records. No language interpreter was used.  Flank Pain  Pertinent negatives include no chest pain, no abdominal pain, no headaches and no shortness of breath.    Past Medical History:  Diagnosis Date  . Anxiety   . Depression   . Diabetes mellitus without complication (HCC)   . H/O mammogram 2016  . Hypertension   . Hypertension   . Lipid disorder   . Peptic ulcer disease 1996    Patient Active Problem List   Diagnosis Date Noted  . Hypertension 02/19/2016  . Lipid disorder 02/19/2016  . History of gastric bypass 02/19/2016  . Normocytic anemia 02/10/2015  . Peptic ulcer disease 02/07/1994    Past Surgical History:  Procedure Laterality Date  . ABDOMINAL HYSTERECTOMY  2005  . CHOLECYSTECTOMY  2005  . COLONOSCOPY  2014  . GASTRIC  BYPASS  2014    OB History    No data available       Home Medications    Prior to Admission medications   Medication Sig Start Date End Date Taking? Authorizing Provider  atorvastatin (LIPITOR) 20 MG tablet Take 1 tablet (20 mg total) by mouth daily. 04/04/16  Yes Jaclyn Shaggy, MD  carbamide peroxide (DEBROX) 6.5 % otic solution Place 5 drops into both ears 2 (two) times daily. 02/02/16  Yes Maple Mirza A, NP  cyclobenzaprine (FLEXERIL) 10 MG tablet Take 1 tablet (10 mg total) by mouth 2 (two) times daily as needed for muscle spasms. 04/04/16  Yes Jaclyn Shaggy, MD  furosemide (LASIX) 20 MG tablet Take 1 tablet (20 mg total) by mouth every morning. 04/04/16  Yes Jaclyn Shaggy, MD  insulin aspart (NOVOLOG) 100 UNIT/ML injection 0-12 units as per sliding scale Patient taking differently: Inject 0-12 Units into the skin 2 (two) times daily as needed for high blood sugar. Per sliding scale 04/04/16  Yes Amao, Odette Horns, MD  insulin detemir (LEVEMIR) 100 UNIT/ML injection Inject subcutaneously twice daily 25 units in the morning and evening Patient taking differently: Inject 25 Units into the skin 2 (two) times daily.  06/06/16  Yes Jaclyn Shaggy, MD  lisinopril (PRINIVIL,ZESTRIL) 10 MG tablet Take 1 tablet (10 mg total) by mouth daily. 06/06/16  Yes Jaclyn Shaggy, MD  metFORMIN (GLUCOPHAGE) 1000 MG tablet Take 1 tablet (1,000 mg total) by mouth 2 (two) times daily with  a meal. 04/04/16  Yes Amao, Odette Horns, MD  omeprazole (PRILOSEC) 40 MG capsule Take 1 capsule (40 mg total) by mouth every evening. 04/04/16  Yes Jaclyn Shaggy, MD  cetirizine (ZYRTEC) 10 MG tablet Take 1 tablet (10 mg total) by mouth daily. Patient not taking: Reported on 06/06/2016 04/04/16   Jaclyn Shaggy, MD  glucose blood (ACCU-CHEK AVIVA) test strip Use 3 times daily before meals Patient not taking: Reported on 06/18/2016 06/06/16   Jaclyn Shaggy, MD  Insulin Pen Needle 31G X 5 MM MISC 1 each by Does not apply route at  bedtime. Patient not taking: Reported on 06/18/2016 06/06/16   Jaclyn Shaggy, MD  Lancet Devices [facility]) lancets Use 3 times daily before meals Patient not taking: Reported on 06/18/2016 06/06/16   Jaclyn Shaggy, MD  naproxen (NAPROSYN) 250 MG tablet Take 1 tablet (250 mg total) by mouth 2 (two) times daily with a meal. 06/18/16   Everlene Farrier, PA-C    Family History Family History  Problem Relation Age of Onset  . Diabetes Mother   . Heart disease Mother   . Breast cancer Mother   . Heart disease Father   . Diabetes Father     Social History Social History  Substance Use Topics  . Smoking status: Never Smoker  . Smokeless tobacco: Never Used  . Alcohol use Yes     Comment: weekends     Allergies   Patient has no known allergies.   Review of Systems Review of Systems  Constitutional: Negative for chills and fever.  HENT: Negative for congestion and sore throat.   Eyes: Negative for visual disturbance.  Respiratory: Negative for cough and shortness of breath.   Cardiovascular: Negative for chest pain and palpitations.  Gastrointestinal: Negative for abdominal pain, diarrhea, nausea and vomiting.  Genitourinary: Positive for flank pain. Negative for decreased urine volume, difficulty urinating, dysuria, frequency, genital sores and urgency.  Musculoskeletal: Positive for back pain. Negative for neck pain.  Skin: Negative for rash.  Neurological: Negative for weakness, numbness and headaches.     Physical Exam Updated Vital Signs BP (!) 148/83 (BP Location: Left Arm)   Pulse 81   Temp 97.8 F (36.6 C) (Oral)   Resp 17   Ht 5\' 6"  (1.676 m)   Wt 85.3 kg   SpO2 100%   BMI 30.34 kg/m   Physical Exam  Constitutional: She appears well-developed and well-nourished. No distress.  Nontoxic appearing.  HENT:  Head: Normocephalic and atraumatic.  Mouth/Throat: Oropharynx is clear and moist.  Eyes: Conjunctivae are normal. Pupils are equal, round, and  reactive to light. Right eye exhibits no discharge. Left eye exhibits no discharge.  Neck: Neck supple.  Cardiovascular: Normal rate, regular rhythm, normal heart sounds and intact distal pulses.  Exam reveals no gallop and no friction rub.   No murmur heard. Pulmonary/Chest: Effort normal and breath sounds normal. No respiratory distress. She has no wheezes. She has no rales.  Abdominal: Soft. Bowel sounds are normal. She exhibits no distension and no mass. There is no tenderness. There is no guarding.  Abdomen is soft and nontender to palpation. No CVA or flank tenderness.  Musculoskeletal: Normal range of motion. She exhibits tenderness. She exhibits no edema or deformity.  Tenderness to her right low back musculature which reproduces her pain. No midline back tenderness. No back erythema, deformity or ecchymosis. No lower extremity edema or tenderness.  Lymphadenopathy:    She has no cervical adenopathy.  Neurological: She is alert.  She displays normal reflexes. No sensory deficit. Coordination normal.  Bilateral patellar DTRs are intact. Normal gait. Sensation is intact to her bilateral lower extremities.  Skin: Skin is warm and dry. No rash noted. She is not diaphoretic. No erythema. No pallor.  Psychiatric: She has a normal mood and affect. Her behavior is normal.  Nursing note and vitals reviewed.    ED Treatments / Results  Labs (all labs ordered are listed, but only abnormal results are displayed) Labs Reviewed  URINALYSIS, ROUTINE W REFLEX MICROSCOPIC - Abnormal; Notable for the following:       Result Value   APPearance HAZY (*)    Leukocytes, UA TRACE (*)    All other components within normal limits  URINALYSIS, MICROSCOPIC (REFLEX) - Abnormal; Notable for the following:    Bacteria, UA FEW (*)    Squamous Epithelial / LPF 6-30 (*)    Non Squamous Epithelial PRESENT (*)    All other components within normal limits  I-STAT CHEM 8, ED - Abnormal; Notable for the following:     Glucose, Bld 100 (*)    Hemoglobin 10.2 (*)    HCT 30.0 (*)    All other components within normal limits    EKG  EKG Interpretation None       Radiology No results found.  Procedures Procedures (including critical care time)  Medications Ordered in ED Medications  oxyCODONE (Oxy IR/ROXICODONE) immediate release tablet 5 mg (5 mg Oral Given 06/18/16 0851)     Initial Impression / Assessment and Plan / ED Course  I have reviewed the triage vital signs and the nursing notes.  Pertinent labs & imaging results that were available during my care of the patient were reviewed by me and considered in my medical decision making (see chart for details).    This is a 56 y.o. Female who presents to the emergency department complaining of right back/flank pain ongoing for the past week. She reports this is been getting worse. She tells me she believes she has kidney infection. She denies any urinary symptoms. She reports her pain is in her right low back and is worse with movement. She took 2 Tylenol around 6 AM this morning with little relief. She reports her pain is constant and is worse with movement and walking. No recent urinary tract infections or history of pyelonephritis. She denies injury or trauma to her back.  On exam the patient is afebrile nontoxic appearing. She has tenderness to palpation overlying her right low back musculature. No CVA or flank tenderness. Urinalysis is without sign of infection. Blood work shows normal kidney function and hemoglobin around her baseline. Patient with right low back pain.  No neurological deficits and normal neuro exam.  Patient can walk but states is painful.  No loss of bowel or bladder control.  No concern for cauda equina.  No fever, night sweats, weight loss, h/o cancer, IVDU.  RICE protocol and pain medicine indicated and discussed with patient. Will do a short course of naproxen for pain and educated on back exercises. I advised the patient  to follow-up with their primary care provider this week. I advised the patient to return to the emergency department with new or worsening symptoms or new concerns. The patient verbalized understanding and agreement with plan.     Final Clinical Impressions(s) / ED Diagnoses   Final diagnoses:  Acute right-sided low back pain without sciatica    New Prescriptions New Prescriptions   NAPROXEN (NAPROSYN) 250 MG  TABLET    Take 1 tablet (250 mg total) by mouth 2 (two) times daily with a meal.     Everlene FarrierDansie, Jehan Bonano, PA-C 06/18/16 0931    Lavera GuiseLiu, Dana Duo, MD 06/18/16 [phone]

## 2016-06-18 NOTE — ED Triage Notes (Signed)
Pt. Stated, I've had right side kidney pain for a week , no urine problems.

## 2016-06-27 ENCOUNTER — Other Ambulatory Visit: Payer: Self-pay | Admitting: Family Medicine

## 2016-06-27 DIAGNOSIS — E789 Disorder of lipoprotein metabolism, unspecified: Secondary | ICD-10-CM

## 2016-06-27 DIAGNOSIS — K279 Peptic ulcer, site unspecified, unspecified as acute or chronic, without hemorrhage or perforation: Secondary | ICD-10-CM

## 2016-06-27 DIAGNOSIS — Z23 Encounter for immunization: Secondary | ICD-10-CM

## 2016-06-27 DIAGNOSIS — Z794 Long term (current) use of insulin: Secondary | ICD-10-CM

## 2016-06-27 DIAGNOSIS — I10 Essential (primary) hypertension: Secondary | ICD-10-CM

## 2016-06-27 DIAGNOSIS — E119 Type 2 diabetes mellitus without complications: Secondary | ICD-10-CM

## 2016-06-27 DIAGNOSIS — Z1239 Encounter for other screening for malignant neoplasm of breast: Secondary | ICD-10-CM

## 2016-06-27 DIAGNOSIS — R0982 Postnasal drip: Secondary | ICD-10-CM

## 2016-07-18 ENCOUNTER — Other Ambulatory Visit: Payer: Self-pay | Admitting: Family Medicine

## 2016-07-19 ENCOUNTER — Ambulatory Visit: Payer: Medicare HMO | Attending: Family Medicine | Admitting: Family Medicine

## 2016-07-19 ENCOUNTER — Encounter: Payer: Self-pay | Admitting: Family Medicine

## 2016-07-19 VITALS — BP 101/56 | HR 78 | Temp 98.2°F | Resp 18 | Ht 66.0 in | Wt 183.0 lb

## 2016-07-19 DIAGNOSIS — Z8711 Personal history of peptic ulcer disease: Secondary | ICD-10-CM | POA: Diagnosis not present

## 2016-07-19 DIAGNOSIS — Z794 Long term (current) use of insulin: Secondary | ICD-10-CM | POA: Insufficient documentation

## 2016-07-19 DIAGNOSIS — Z90710 Acquired absence of both cervix and uterus: Secondary | ICD-10-CM | POA: Insufficient documentation

## 2016-07-19 DIAGNOSIS — F419 Anxiety disorder, unspecified: Secondary | ICD-10-CM | POA: Diagnosis not present

## 2016-07-19 DIAGNOSIS — M549 Dorsalgia, unspecified: Secondary | ICD-10-CM | POA: Diagnosis present

## 2016-07-19 DIAGNOSIS — I10 Essential (primary) hypertension: Secondary | ICD-10-CM | POA: Diagnosis not present

## 2016-07-19 DIAGNOSIS — F329 Major depressive disorder, single episode, unspecified: Secondary | ICD-10-CM | POA: Insufficient documentation

## 2016-07-19 DIAGNOSIS — Z9884 Bariatric surgery status: Secondary | ICD-10-CM | POA: Insufficient documentation

## 2016-07-19 DIAGNOSIS — E119 Type 2 diabetes mellitus without complications: Secondary | ICD-10-CM | POA: Insufficient documentation

## 2016-07-19 DIAGNOSIS — E785 Hyperlipidemia, unspecified: Secondary | ICD-10-CM | POA: Insufficient documentation

## 2016-07-19 DIAGNOSIS — M545 Low back pain: Secondary | ICD-10-CM | POA: Diagnosis not present

## 2016-07-19 DIAGNOSIS — Z9049 Acquired absence of other specified parts of digestive tract: Secondary | ICD-10-CM | POA: Diagnosis not present

## 2016-07-19 DIAGNOSIS — G8929 Other chronic pain: Secondary | ICD-10-CM | POA: Diagnosis not present

## 2016-07-19 MED ORDER — NAPROXEN 250 MG PO TABS: 250.0000 mg | ORAL_TABLET | Freq: Two times a day (BID) | ORAL | 1 refills | Status: DC

## 2016-07-19 NOTE — Progress Notes (Signed)
Subjective:  Patient ID: Chelsea Rodriguez, female    DOB: 1960-02-22  Age: 56 y.o. MRN: [id number]  CC: Back Pain   HPI Heidee Behe is a 56 year old female with a history of type 2 diabetes mellitus (A1c 6.5 which is down from 7.6), hypertension, dyslipidemia, peptic ulcer disease who comes in for follow-up of Right-sided lower back pain for which she was seen at the ED one month ago. She received a prescription for naproxen which helps her symptoms.  Pain is intermittent and rated as mild at this time. She denies radiation of pain and has no numbness of her extremities or loss of sphincteric function. She works at [employer] and has to stand throughout her shift at work.  Past Medical History:  Diagnosis Date  . Anxiety   . Depression   . Diabetes mellitus without complication (HCC)   . H/O mammogram 2016  . Hypertension   . Hypertension   . Lipid disorder   . Peptic ulcer disease 1996    Past Surgical History:  Procedure Laterality Date  . ABDOMINAL HYSTERECTOMY  2005  . CHOLECYSTECTOMY  2005  . COLONOSCOPY  2014  . GASTRIC BYPASS  2014    No Known Allergies   Outpatient Medications Prior to Visit  Medication Sig Dispense Refill  . atorvastatin (LIPITOR) 20 MG tablet Take 1 tablet (20 mg total) by mouth daily. 90 tablet 1  . carbamide peroxide (DEBROX) 6.5 % otic solution Place 5 drops into both ears 2 (two) times daily. 15 mL 0  . cetirizine (ZYRTEC) 10 MG tablet Take 1 tablet (10 mg total) by mouth daily. 90 tablet 1  . cyclobenzaprine (FLEXERIL) 10 MG tablet TAKE 1 TABLET TWICE DAILY AS NEEDED FOR MUSCLE SPASM(S) 90 tablet 1  . furosemide (LASIX) 20 MG tablet Take 1 tablet (20 mg total) by mouth every morning. 90 tablet 1  . glucose blood (ACCU-CHEK AVIVA) test strip Use 3 times daily before meals 100 each 12  . insulin detemir (LEVEMIR) 100 UNIT/ML injection Inject subcutaneously twice daily 25 units in the morning and evening (Patient taking differently:  Inject 25 Units into the skin 2 (two) times daily. ) 30 mL 11  . Insulin Pen Needle 31G X 5 MM MISC 1 each by Does not apply route at bedtime. 30 each 5  . Lancet Devices (ACCU-CHEK SOFTCLIX) lancets Use 3 times daily before meals 1 each 5  . lisinopril (PRINIVIL,ZESTRIL) 10 MG tablet Take 1 tablet (10 mg total) by mouth daily. 90 tablet 1  . metFORMIN (GLUCOPHAGE) 1000 MG tablet Take 1 tablet (1,000 mg total) by mouth 2 (two) times daily with a meal. 180 tablet 1  . NOVOLOG FLEXPEN 100 UNIT/ML FlexPen INJECT 0 TO 12 UNITS THREE TIMES DAILY BEFORE MEALS AS PER SLIDING SCALE (DISCARD AND BEGIN A NEW PEN EVERY 28 DAYS) 45 mL 0  . omeprazole (PRILOSEC) 40 MG capsule Take 1 capsule (40 mg total) by mouth every evening. 90 capsule 1  . naproxen (NAPROSYN) 250 MG tablet Take 1 tablet (250 mg total) by mouth 2 (two) times daily with a meal. 20 tablet 0   No facility-administered medications prior to visit.     ROS Review of Systems  Constitutional: Negative for activity change, appetite change and fatigue.  HENT: Negative for congestion, sinus pressure and sore throat.   Eyes: Negative for visual disturbance.  Respiratory: Negative for cough, chest tightness, shortness of breath and wheezing.   Cardiovascular: Negative for chest pain and palpitations.  Gastrointestinal: Negative for abdominal distention, abdominal pain and constipation.  Endocrine: Negative for polydipsia.  Genitourinary: Negative for dysuria and frequency.  Musculoskeletal: Positive for back pain. Negative for arthralgias.  Skin: Negative for rash.  Neurological: Negative for tremors, light-headedness and numbness.  Hematological: Does not bruise/bleed easily.  Psychiatric/Behavioral: Negative for agitation and behavioral problems.    Objective:  BP (!) 101/56 (BP Location: Left Arm, Patient Position: Sitting, Cuff Size: Normal)   Pulse 78   Temp 98.2 F (36.8 C) (Oral)   Resp 18   Ht 5\' 6"  (1.676 m)   Wt 183 lb (83 kg)    SpO2 100%   BMI 29.54 kg/m   BP/Weight 07/19/2016 06/18/2016 06/06/2016  Systolic BP 101 122 125  Diastolic BP 56 60 78  Wt. (Lbs) 183 188 188.8  BMI 29.54 30.34 30.47      Physical Exam  Constitutional: She is oriented to person, place, and time. She appears well-developed and well-nourished.  Cardiovascular: Normal rate, normal heart sounds and intact distal pulses.   No murmur heard. Pulmonary/Chest: Effort normal and breath sounds normal. She has no wheezes. She has no rales. She exhibits no tenderness.  Abdominal: Soft. Bowel sounds are normal. She exhibits no distension and no mass. There is no tenderness.  Musculoskeletal: Normal range of motion.  Neurological: She is alert and oriented to person, place, and time.  Skin: Skin is warm and dry.  Psychiatric: She has a normal mood and affect.     Assessment & Plan:   1. Chronic right-sided low back pain without sciatica Controlled Advised to use Naproxen prn due to previous h/o PUD - naproxen (NAPROSYN) 250 MG tablet; Take 1 tablet (250 mg total) by mouth 2 (two) times daily with a meal.  Dispense: 60 tablet; Refill: 1   Meds ordered this encounter  Medications  . naproxen (NAPROSYN) 250 MG tablet    Sig: Take 1 tablet (250 mg total) by mouth 2 (two) times daily with a meal.    Dispense:  60 tablet    Refill:  1    Follow-up: Return for follow up of chronic medical conditions please keep previously scheduled appoinmtent.   Jaclyn ShaggyEnobong Amao MD

## 2016-07-19 NOTE — Patient Instructions (Signed)

## 2016-07-19 NOTE — Progress Notes (Signed)
Patient is here for Back pain FU  Patient denies pain at this time.  Patient has taken medication today. Patient has eaten today.

## 2016-08-10 ENCOUNTER — Other Ambulatory Visit: Payer: Self-pay | Admitting: Family Medicine

## 2016-08-10 DIAGNOSIS — E119 Type 2 diabetes mellitus without complications: Secondary | ICD-10-CM

## 2016-08-31 ENCOUNTER — Other Ambulatory Visit: Payer: Self-pay | Admitting: Family Medicine

## 2016-08-31 DIAGNOSIS — G8929 Other chronic pain: Secondary | ICD-10-CM

## 2016-08-31 DIAGNOSIS — E789 Disorder of lipoprotein metabolism, unspecified: Secondary | ICD-10-CM

## 2016-08-31 DIAGNOSIS — M545 Low back pain, unspecified: Secondary | ICD-10-CM

## 2016-08-31 DIAGNOSIS — R0982 Postnasal drip: Secondary | ICD-10-CM

## 2016-08-31 DIAGNOSIS — I10 Essential (primary) hypertension: Secondary | ICD-10-CM

## 2016-08-31 DIAGNOSIS — K279 Peptic ulcer, site unspecified, unspecified as acute or chronic, without hemorrhage or perforation: Secondary | ICD-10-CM

## 2016-08-31 DIAGNOSIS — Z23 Encounter for immunization: Secondary | ICD-10-CM

## 2016-08-31 DIAGNOSIS — Z1239 Encounter for other screening for malignant neoplasm of breast: Secondary | ICD-10-CM

## 2016-08-31 DIAGNOSIS — Z794 Long term (current) use of insulin: Secondary | ICD-10-CM

## 2016-08-31 DIAGNOSIS — E119 Type 2 diabetes mellitus without complications: Secondary | ICD-10-CM

## 2016-09-02 ENCOUNTER — Other Ambulatory Visit: Payer: Self-pay | Admitting: Family Medicine

## 2016-09-02 DIAGNOSIS — Z794 Long term (current) use of insulin: Secondary | ICD-10-CM

## 2016-09-02 DIAGNOSIS — Z23 Encounter for immunization: Secondary | ICD-10-CM

## 2016-09-02 DIAGNOSIS — E119 Type 2 diabetes mellitus without complications: Secondary | ICD-10-CM

## 2016-09-02 DIAGNOSIS — I10 Essential (primary) hypertension: Secondary | ICD-10-CM

## 2016-09-02 DIAGNOSIS — R0982 Postnasal drip: Secondary | ICD-10-CM

## 2016-09-02 DIAGNOSIS — K279 Peptic ulcer, site unspecified, unspecified as acute or chronic, without hemorrhage or perforation: Secondary | ICD-10-CM

## 2016-09-02 DIAGNOSIS — E789 Disorder of lipoprotein metabolism, unspecified: Secondary | ICD-10-CM

## 2016-09-02 DIAGNOSIS — Z1239 Encounter for other screening for malignant neoplasm of breast: Secondary | ICD-10-CM

## 2016-09-08 ENCOUNTER — Ambulatory Visit: Payer: Medicare HMO | Attending: Family Medicine | Admitting: Family Medicine

## 2016-09-08 ENCOUNTER — Encounter: Payer: Self-pay | Admitting: Family Medicine

## 2016-09-08 VITALS — BP 100/69 | HR 85 | Temp 98.5°F | Resp 16 | Ht 66.0 in | Wt 180.0 lb

## 2016-09-08 DIAGNOSIS — K279 Peptic ulcer, site unspecified, unspecified as acute or chronic, without hemorrhage or perforation: Secondary | ICD-10-CM

## 2016-09-08 DIAGNOSIS — E785 Hyperlipidemia, unspecified: Secondary | ICD-10-CM | POA: Insufficient documentation

## 2016-09-08 DIAGNOSIS — Z23 Encounter for immunization: Secondary | ICD-10-CM | POA: Diagnosis not present

## 2016-09-08 DIAGNOSIS — Z9884 Bariatric surgery status: Secondary | ICD-10-CM | POA: Diagnosis not present

## 2016-09-08 DIAGNOSIS — E1149 Type 2 diabetes mellitus with other diabetic neurological complication: Secondary | ICD-10-CM

## 2016-09-08 DIAGNOSIS — E7800 Pure hypercholesterolemia, unspecified: Secondary | ICD-10-CM | POA: Diagnosis not present

## 2016-09-08 DIAGNOSIS — Z794 Long term (current) use of insulin: Secondary | ICD-10-CM | POA: Diagnosis not present

## 2016-09-08 DIAGNOSIS — Z8711 Personal history of peptic ulcer disease: Secondary | ICD-10-CM | POA: Diagnosis not present

## 2016-09-08 DIAGNOSIS — I10 Essential (primary) hypertension: Secondary | ICD-10-CM

## 2016-09-08 DIAGNOSIS — E1142 Type 2 diabetes mellitus with diabetic polyneuropathy: Secondary | ICD-10-CM | POA: Diagnosis not present

## 2016-09-08 DIAGNOSIS — Z79899 Other long term (current) drug therapy: Secondary | ICD-10-CM | POA: Diagnosis not present

## 2016-09-08 LAB — GLUCOSE, POCT (MANUAL RESULT ENTRY): POC GLUCOSE: 145 mg/dL — AB (ref 70–99)

## 2016-09-08 LAB — POCT GLYCOSYLATED HEMOGLOBIN (HGB A1C): Hemoglobin A1C: 6.7

## 2016-09-08 MED ORDER — INSULIN DETEMIR 100 UNIT/ML ~~LOC~~ SOLN: SUBCUTANEOUS | 5 refills | Status: DC

## 2016-09-08 MED ORDER — GABAPENTIN 300 MG PO CAPS: 300.0000 mg | ORAL_CAPSULE | Freq: Every day | ORAL | 5 refills | Status: DC

## 2016-09-08 NOTE — Progress Notes (Signed)
Subjective:    Patient ID: Chelsea Rodriguez, female    DOB: Nov 01, 1960, 56 y.o.   MRN: [id number]  HPI She is a 56 year old female with a history of type 2 diabetes mellitus (A1c 6.7), hypertension, dyslipidemia, peptic ulcer disease who comes in for follow-up of her diabetes mellitus.  She currently takes 25 units of Levemir twice daily and states she sometimes is woken up by hypoglycemic episodes at night but never checks her blood sugar at night. Review of her blood sugar log indicates a fasting sugars in the 90-110 range and evening sugars between 80 and 140 She also has Novolog sliding scale she rarely has to use.  She has been compliant with a diabetic diet, low sodium diet and has been taking her antihypertensive and statin  She endorses numbness and tingling in her hands and feet and sometimes has  pin and needle sensations. Her exam was 2 months ago.  Denies any acute concerns today.  Past Medical History:  Diagnosis Date  . Anxiety   . Depression   . Diabetes mellitus without complication (HCC)   . H/O mammogram 2016  . Hypertension   . Hypertension   . Lipid disorder   . Peptic ulcer disease 1996    Past Surgical History:  Procedure Laterality Date  . ABDOMINAL HYSTERECTOMY  2005  . CHOLECYSTECTOMY  2005  . COLONOSCOPY  2014  . GASTRIC BYPASS  2014    No Known Allergies  Current Outpatient Prescriptions on File Prior to Visit  Medication Sig Dispense Refill  . atorvastatin (LIPITOR) 20 MG tablet TAKE 1 TABLET EVERY DAY 90 tablet 0  . B-D UF III MINI PEN NEEDLES 31G X 5 MM MISC USE  AS  DIRECTED 450 each 5  . carbamide peroxide (DEBROX) 6.5 % otic solution Place 5 drops into both ears 2 (two) times daily. 15 mL 0  . cetirizine (ZYRTEC) 10 MG tablet Take 1 tablet (10 mg total) by mouth daily. 90 tablet 1  . cyclobenzaprine (FLEXERIL) 10 MG tablet TAKE 1 TABLET TWICE DAILY AS NEEDED FOR MUSCLE SPASM(S) 90 tablet 1  . furosemide (LASIX) 20 MG tablet TAKE 1 TABLET  EVERY MORNING 90 tablet 0  . lisinopril (PRINIVIL,ZESTRIL) 10 MG tablet Take 1 tablet (10 mg total) by mouth daily. 90 tablet 1  . metFORMIN (GLUCOPHAGE) 1000 MG tablet TAKE 1 TABLET TWICE DAILY WITH A MEAL 180 tablet 0  . NOVOLOG FLEXPEN 100 UNIT/ML FlexPen INJECT 0 TO 12 UNITS THREE TIMES DAILY BEFORE MEALS AS PER SLIDING SCALE (DISCARD AND BEGIN A NEW PEN EVERY 28 DAYS) 45 mL 0  . omeprazole (PRILOSEC) 40 MG capsule TAKE 1 CAPSULE EVERY EVENING 90 capsule 0  . glucose blood (ACCU-CHEK AVIVA) test strip Use 3 times daily before meals 100 each 12  . Lancet Devices (ACCU-CHEK SOFTCLIX) lancets Use 3 times daily before meals 1 each 5  . naproxen (NAPROSYN) 250 MG tablet TAKE 1 TABLET TWICE DAILY WITH A MEAL 120 tablet 1   No current facility-administered medications on file prior to visit.       Review of Systems  Constitutional: Negative for activity change, appetite change and fatigue.  HENT: Negative for congestion, sinus pressure and sore throat.   Eyes: Negative for visual disturbance.  Respiratory: Negative for cough, chest tightness, shortness of breath and wheezing.   Cardiovascular: Negative for chest pain and palpitations.  Gastrointestinal: Negative for abdominal distention, abdominal pain and constipation.  Endocrine: Negative for polydipsia.  Genitourinary: Negative for  dysuria and frequency.  Musculoskeletal: Negative for arthralgias and back pain.  Skin: Negative for rash.  Neurological: Positive for numbness. Negative for tremors and light-headedness.  Hematological: Does not bruise/bleed easily.  Psychiatric/Behavioral: Negative for agitation and behavioral problems.       Objective: Vitals:   09/08/16 0909  BP: 100/69  Pulse: 85  Resp: 16  SpO2: 100%      Physical Exam  Constitutional: She is oriented to person, place, and time. She appears well-developed and well-nourished.  Neck: No JVD present.  Cardiovascular: Normal rate, normal heart sounds and intact  distal pulses.   No murmur heard. Pulmonary/Chest: Effort normal and breath sounds normal. She has no wheezes. She has no rales. She exhibits no tenderness.  Abdominal: Soft. Bowel sounds are normal. She exhibits no distension and no mass. There is no tenderness.  Musculoskeletal: Normal range of motion.  Neurological: She is alert and oriented to person, place, and time.  Skin: Skin is warm and dry.  Psychiatric: She has a normal mood and affect.    CMP Latest Ref Rng & Units 06/18/2016 02/22/2016 05/18/2015  Glucose 65 - 99 mg/dL 914(N100(H) 829(F157(H) 621(H110(H)  BUN 6 - 20 mg/dL 11 8 11   Creatinine 0.44 - 1.00 mg/dL 0.861.00 5.780.70 4.690.70  Sodium 135 - 145 mmol/L 143 140 143  Potassium 3.5 - 5.1 mmol/L 4.2 4.1 3.9  Chloride 101 - 111 mmol/L 110 103 110  CO2 20 - 31 mmol/L - 27 23  Calcium 8.6 - 10.4 mg/dL - 9.8 6.2(X8.8(L)  Total Protein 6.1 - 8.1 g/dL - 7.5 6.3(L)  Total Bilirubin 0.2 - 1.2 mg/dL - 0.6 0.4  Alkaline Phos 33 - 130 U/L - 79 54  AST 10 - 35 U/L - 17 31  ALT 6 - 29 U/L - 20 25    Lipid Panel     Component Value Date/Time   CHOL 107 02/22/2016 0841   TRIG 57 02/22/2016 0841   HDL 56 02/22/2016 0841   CHOLHDL 1.9 02/22/2016 0841    Lab Results  Component Value Date   HGBA1C 6.7 09/08/2016         Assessment & Plan:  1. Essential hypertension Controlled Low-sodium diet - lisinopril (PRINIVIL,ZESTRIL) 10 MG tablet; Take 1 tablet (10 mg total) by mouth daily.  Dispense: 90 tablet; Refill: 1  2. Diabetes mellitus with diabetic neuropathy (HCC) Controlled with A1c of 6.7  Reduced dose of Levemir to 25 units in the morning and 20 in the evening given symptoms of nighttime hypoglycemia  Continue NovoLog sliding scale Gabapentin added for neuropathy-discussed sedating side effects and she knows to take it at night. - Glucose (CBG) - HgB A1c - insulin detemir (LEVEMIR) 100 UNIT/ML injection; Inject subcutaneously twice daily 25 units in the morning and 20 units in the evening   Dispense: 30 mL; Refill: 11 - Insulin Pen Needle 31G X 5 MM MISC; 1 each by Does not apply route at bedtime.  Dispense: 30 each; Refill: 5 - Lancet Devices (ACCU-CHEK SOFTCLIX) lancets; Use 3 times daily before meals  Dispense: 1 each; Refill: 5 - glucose blood (ACCU-CHEK AVIVA) test strip; Use 3 times daily before meals  Dispense: 100 each; Refill: 12  3. Lipid disorder Controlled Continue statin  4. Peptic ulcer disease Stable No acute flares  5. Need for T-dap  This note has been created with Education officer, [employer]Dragon speech recognition software and smart phrase technology. Any transcriptional errors are unintentional.

## 2016-09-08 NOTE — Progress Notes (Signed)
Pt has eaten  Pt has taken meds F/u DM

## 2016-09-08 NOTE — Patient Instructions (Signed)
Diabetic Neuropathy Diabetic neuropathy is a nerve disease or nerve damage that is caused by diabetes mellitus. About half of all people with diabetes mellitus have some form of nerve damage. Nerve damage is more common in those who have had diabetes mellitus for many years and who generally have not had good control of their blood sugar (glucose) level. Diabetic neuropathy is a common complication of diabetes mellitus. There are three common types of diabetic neuropathy and a fourth type that is less common and less understood:  Peripheral neuropathy-This is the most common type of diabetic neuropathy. It causes damage to the nerves of the feet and legs first and then eventually the hands and arms. The damage affects the ability to sense touch.  Autonomic neuropathy-This type causes damage to the autonomic nervous system, which controls the following functions: ? Heartbeat. ? Body temperature. ? Blood pressure. ? Urination. ? Digestion. ? Sweating. ? Sexual function.  Focal neuropathy-Focal neuropathy can be painful and unpredictable and occurs most often in older adults with diabetes mellitus. It involves a specific nerve or one area and often comes on suddenly. It usually does not cause long-term problems.  Radiculoplexus neuropathy- Sometimes called lumbosacral radiculoplexus neuropathy, radiculoplexus neuropathy affects the nerves of the thighs, hips, buttocks, or legs. It is more common in people with type 2 diabetes mellitus and in older men. It is characterized by debilitating pain, weakness, and atrophy, usually in the thigh muscles.  What are the causes? The cause of peripheral, autonomic, and focal neuropathies is diabetes mellitus that is uncontrolled and high glucose levels. The cause of radiculoplexus neuropathy is unknown. However, it is thought to be caused by inflammation related to uncontrolled glucose levels. What are the signs or symptoms? Peripheral Neuropathy Peripheral  neuropathy develops slowly over time. When the nerves of the feet and legs no longer work there may be:  Burning, stabbing, or aching pain in the legs or feet.  Inability to feel pressure or pain in your feet. This can lead to: ? Thick calluses over pressure areas. ? Pressure sores. ? Ulcers.  Foot deformities.  Reduced ability to feel temperature changes.  Muscle weakness.  Autonomic Neuropathy The symptoms of autonomic neuropathy vary depending on which nerves are affected. Symptoms may include:  Problems with digestion, such as: ? Feeling sick to your stomach (nausea). ? Vomiting. ? Bloating. ? Constipation. ? Diarrhea. ? Abdominal pain.  Difficulty with urination. This occurs if you lose your ability to sense when your bladder is full. Problems include: ? Urine leakage (incontinence). ? Inability to empty your bladder completely (retention).  Rapid or irregular heartbeat (palpitations).  Blood pressure drops when you stand up (orthostatic hypotension). When you stand up you may feel: ? Dizzy. ? Weak. ? Faint.  In men, inability to attain and maintain an erection.  In women, vaginal dryness and problems with decreased sexual desire and arousal.  Problems with body temperature regulation.  Increased or decreased sweating.  Focal Neuropathy  Abnormal eye movements or abnormal alignment of both eyes.  Weakness in the wrist.  Foot drop. This results in an inability to lift the foot properly and abnormal walking or foot movement.  Paralysis on one side of your face (Bell palsy).  Chest or abdominal pain. Radiculoplexus Neuropathy  Sudden, severe pain in your hip, thigh, or buttocks.  Weakness and wasting of thigh muscles.  Difficulty rising from a seated position.  Abdominal swelling.  Unexplained weight loss (usually more than 10 lb [4.5 kg]). How is   this diagnosed? Peripheral Neuropathy Your senses may be tested. Sensory function testing can be  done with:  A light touch using a monofilament.  A vibration with tuning fork.  A sharp sensation with a pin prick.  Other tests that can help diagnose neuropathy are:  Nerve conduction velocity. This test checks the transmission of an electrical current through a nerve.  Electromyography. This shows how muscles respond to electrical signals transmitted by nearby nerves.  Quantitative sensory testing. This is used to assess how your nerves respond to vibrations and changes in temperature.  Autonomic Neuropathy Diagnosis is often based on reported symptoms. Tell your health care provider if you experience:  Dizziness.  Constipation.  Diarrhea.  Inappropriate urination or inability to urinate.  Inability to get or maintain an erection.  Tests that may be done include:  Electrocardiography or Holter monitor. These are tests that can help show problems with the heart rate or heart rhythm.  An X-ray exam may be done.  Focal Neuropathy Diagnosis is made based on your symptoms and what your health care provider finds during your exam. Other tests may be done. They may include:  Nerve conduction velocities. This checks the transmission of electrical current through a nerve.  Electromyography. This shows how muscles respond to electrical signals transmitted by nearby nerves.  Quantitative sensory testing. This test is used to assess how your nerves respond to vibration and changes in temperature.  Radiculoplexus Neuropathy  Often the first thing is to eliminate any other issue or problems that might be the cause, as there is no standard test for diagnosis.  X-ray exam of your spine and lumbar region.  Spinal tap to rule out cancer.  MRI to rule out other lesions. How is this treated? Once nerve damage occurs, it cannot be reversed. The goal of treatment is to keep the disease or nerve damage from getting worse and affecting more nerve fibers. Controlling your blood  glucose level is the key. Most people with radiculoplexus neuropathy see at least a partial improvement over time. You will need to keep your blood glucose and HbA1c levels in the target range determined by your health care provider. Things that help control blood glucose levels include:  Blood glucose monitoring.  Meal planning.  Physical activity.  Diabetes medicine.  Over time, maintaining lower blood glucose levels helps lessen symptoms. Sometimes, prescription pain medicine is needed. Follow these instructions at home:  Do not smoke.  Keep your blood glucose level in the range that you and your health care provider have determined acceptable for you.  Keep your blood pressure level in the range that you and your health care provider have determined acceptable for you.  Eat a well-balanced diet.  Be physically active every day. Include strength training and balance exercises.  Protect your feet. ? Check your feet every day for sores, cuts, blisters, or signs of infection. ? Wear padded socks and supportive shoes. Use orthotic inserts, if necessary. ? Regularly check the insides of your shoes for worn spots. Make sure there are no rocks or other items inside your shoes before you put them on. Contact a health care provider if:  You have burning, stabbing, or aching pain in the legs or feet.  You are unable to feel pressure or pain in your feet.  You develop problems with digestion such as: ? Nausea. ? Vomiting. ? Bloating. ? Constipation. ? Diarrhea. ? Abdominal pain.  You have difficulty with urination, such as: ? Incontinence. ? Retention.    You have palpitations.  You develop orthostatic hypotension. When you stand up you may feel: ? Dizzy. ? Weak. ? Faint.  You cannot attain and maintain an erection (in men).  You have vaginal dryness and problems with decreased sexual desire and arousal (in women).  You have severe pain in your thighs, legs, or  buttocks.  You have unexplained weight loss. This information is not intended to replace advice given to you by your health care provider. Make sure you discuss any questions you have with your health care provider. Document Released: 04/04/2001 Document Revised: 07/02/2015 Document Reviewed: 07/05/2012 Elsevier Interactive Patient Education  2017 [employer]  

## 2016-10-24 ENCOUNTER — Other Ambulatory Visit: Payer: Self-pay | Admitting: Family Medicine

## 2016-10-24 DIAGNOSIS — I10 Essential (primary) hypertension: Secondary | ICD-10-CM

## 2016-11-07 ENCOUNTER — Other Ambulatory Visit: Payer: Self-pay | Admitting: Family Medicine

## 2016-11-07 DIAGNOSIS — I10 Essential (primary) hypertension: Secondary | ICD-10-CM

## 2016-11-07 DIAGNOSIS — E789 Disorder of lipoprotein metabolism, unspecified: Secondary | ICD-10-CM

## 2016-11-07 DIAGNOSIS — Z23 Encounter for immunization: Secondary | ICD-10-CM

## 2016-11-07 DIAGNOSIS — E119 Type 2 diabetes mellitus without complications: Secondary | ICD-10-CM

## 2016-11-07 DIAGNOSIS — Z794 Long term (current) use of insulin: Secondary | ICD-10-CM

## 2016-11-07 DIAGNOSIS — K279 Peptic ulcer, site unspecified, unspecified as acute or chronic, without hemorrhage or perforation: Secondary | ICD-10-CM

## 2016-11-07 DIAGNOSIS — M545 Low back pain: Secondary | ICD-10-CM

## 2016-11-07 DIAGNOSIS — Z1239 Encounter for other screening for malignant neoplasm of breast: Secondary | ICD-10-CM

## 2016-11-07 DIAGNOSIS — R0982 Postnasal drip: Secondary | ICD-10-CM

## 2016-11-07 DIAGNOSIS — G8929 Other chronic pain: Secondary | ICD-10-CM

## 2016-12-16 ENCOUNTER — Encounter: Payer: Self-pay | Admitting: Family Medicine

## 2016-12-16 ENCOUNTER — Ambulatory Visit: Payer: Medicare HMO | Attending: Family Medicine | Admitting: Family Medicine

## 2016-12-16 VITALS — BP 131/76 | HR 72 | Temp 98.3°F | Ht 66.0 in | Wt 192.0 lb

## 2016-12-16 DIAGNOSIS — E1149 Type 2 diabetes mellitus with other diabetic neurological complication: Secondary | ICD-10-CM

## 2016-12-16 DIAGNOSIS — E11649 Type 2 diabetes mellitus with hypoglycemia without coma: Secondary | ICD-10-CM | POA: Insufficient documentation

## 2016-12-16 DIAGNOSIS — Z9049 Acquired absence of other specified parts of digestive tract: Secondary | ICD-10-CM | POA: Diagnosis not present

## 2016-12-16 DIAGNOSIS — Z23 Encounter for immunization: Secondary | ICD-10-CM | POA: Diagnosis not present

## 2016-12-16 DIAGNOSIS — E119 Type 2 diabetes mellitus without complications: Secondary | ICD-10-CM | POA: Diagnosis not present

## 2016-12-16 DIAGNOSIS — E789 Disorder of lipoprotein metabolism, unspecified: Secondary | ICD-10-CM | POA: Diagnosis not present

## 2016-12-16 DIAGNOSIS — F329 Major depressive disorder, single episode, unspecified: Secondary | ICD-10-CM | POA: Insufficient documentation

## 2016-12-16 DIAGNOSIS — Z1159 Encounter for screening for other viral diseases: Secondary | ICD-10-CM | POA: Diagnosis not present

## 2016-12-16 DIAGNOSIS — Z79899 Other long term (current) drug therapy: Secondary | ICD-10-CM | POA: Insufficient documentation

## 2016-12-16 DIAGNOSIS — K279 Peptic ulcer, site unspecified, unspecified as acute or chronic, without hemorrhage or perforation: Secondary | ICD-10-CM | POA: Diagnosis not present

## 2016-12-16 DIAGNOSIS — Z9884 Bariatric surgery status: Secondary | ICD-10-CM | POA: Diagnosis not present

## 2016-12-16 DIAGNOSIS — E785 Hyperlipidemia, unspecified: Secondary | ICD-10-CM | POA: Insufficient documentation

## 2016-12-16 DIAGNOSIS — E669 Obesity, unspecified: Secondary | ICD-10-CM | POA: Insufficient documentation

## 2016-12-16 DIAGNOSIS — I10 Essential (primary) hypertension: Secondary | ICD-10-CM

## 2016-12-16 DIAGNOSIS — Z9889 Other specified postprocedural states: Secondary | ICD-10-CM | POA: Insufficient documentation

## 2016-12-16 DIAGNOSIS — F419 Anxiety disorder, unspecified: Secondary | ICD-10-CM | POA: Insufficient documentation

## 2016-12-16 DIAGNOSIS — Z794 Long term (current) use of insulin: Secondary | ICD-10-CM | POA: Diagnosis not present

## 2016-12-16 DIAGNOSIS — Z90710 Acquired absence of both cervix and uterus: Secondary | ICD-10-CM | POA: Diagnosis not present

## 2016-12-16 LAB — GLUCOSE, POCT (MANUAL RESULT ENTRY): POC GLUCOSE: 106 mg/dL — AB (ref 70–99)

## 2016-12-16 LAB — POCT GLYCOSYLATED HEMOGLOBIN (HGB A1C): Hemoglobin A1C: 6.6

## 2016-12-16 MED ORDER — INSULIN DETEMIR 100 UNIT/ML ~~LOC~~ SOLN: SUBCUTANEOUS | 5 refills | Status: DC

## 2016-12-16 MED ORDER — METFORMIN HCL 1000 MG PO TABS: ORAL_TABLET | ORAL | 1 refills | Status: DC

## 2016-12-16 MED ORDER — CYCLOBENZAPRINE HCL 10 MG PO TABS: ORAL_TABLET | ORAL | 1 refills | Status: DC

## 2016-12-16 MED ORDER — OMEPRAZOLE 40 MG PO CPDR: DELAYED_RELEASE_CAPSULE | ORAL | 1 refills | Status: DC

## 2016-12-16 MED ORDER — INSULIN ASPART 100 UNIT/ML FLEXPEN: PEN_INJECTOR | SUBCUTANEOUS | 1 refills | Status: DC

## 2016-12-16 MED ORDER — FUROSEMIDE 20 MG PO TABS: 20.0000 mg | ORAL_TABLET | Freq: Every morning | ORAL | 1 refills | Status: DC

## 2016-12-16 MED ORDER — ATORVASTATIN CALCIUM 20 MG PO TABS: 20.0000 mg | ORAL_TABLET | Freq: Every day | ORAL | 1 refills | Status: DC

## 2016-12-16 MED ORDER — LISINOPRIL 10 MG PO TABS: 10.0000 mg | ORAL_TABLET | Freq: Every day | ORAL | 1 refills | Status: DC

## 2016-12-16 MED ORDER — GABAPENTIN 300 MG PO CAPS: 300.0000 mg | ORAL_CAPSULE | Freq: Every day | ORAL | 1 refills | Status: DC

## 2016-12-16 NOTE — Progress Notes (Signed)
Subjective:  Patient ID: Chelsea Rodriguez, female    DOB: December 17, 1960  Age: 56 y.o. MRN: [id number]  CC: Diabetes   HPI Chelsea Rodriguez is a 56 year old female with a history of type 2 diabetes mellitus (A1c 6.6), hypertension, dyslipidemia, peptic ulcer disease who comes in for follow-up of her diabetes mellitus.  She had complained of hypoglycemia at her last visit and her dose of Levemir has been reduced; her lowest sugars have been in the 80s and she denies hypoglycemia. Denies numbness in extremities, visual complaints and has been compliant with her medications but she rarely has to use her NovoLog.  Tolerates her antihypertensive and denies any adverse effects.  Compliant with her statin and denies myalgias. She adheres with a diabetic, low-cholesterol, low-sodium diet but does not exercise regularly-the only exercise she gets is at her job at [employer]  She denies any flareup of peptic ulcer disease and has no additional concerns today.  Past Medical History:  Diagnosis Date  . Anxiety   . Depression   . Diabetes mellitus without complication ([place])   . H/O mammogram 2016  . Hypertension   . Hypertension   . Lipid disorder   . Peptic ulcer disease 1996    Past Surgical History:  Procedure Laterality Date  . ABDOMINAL HYSTERECTOMY  2005  . CHOLECYSTECTOMY  2005  . COLONOSCOPY  2014  . GASTRIC BYPASS  2014    No Known Allergies   Outpatient Medications Prior to Visit  Medication Sig Dispense Refill  . B-D UF III MINI PEN NEEDLES 31G X 5 MM MISC USE  AS  DIRECTED 450 each 5  . carbamide peroxide (DEBROX) 6.5 % otic solution Place 5 drops into both ears 2 (two) times daily. 15 mL 0  . cetirizine (ZYRTEC) 10 MG tablet Take 1 tablet (10 mg total) by mouth daily. 90 tablet 1  . glucose blood (ACCU-CHEK AVIVA) test strip Use 3 times daily before meals 100 each 12  . Lancet Devices (ACCU-CHEK SOFTCLIX) lancets Use 3 times daily before meals 1 each 5  . naproxen  (NAPROSYN) 250 MG tablet TAKE 1 TABLET TWICE DAILY WITH MEALS 180 tablet 1  . atorvastatin (LIPITOR) 20 MG tablet TAKE 1 TABLET EVERY DAY 90 tablet 0  . cyclobenzaprine (FLEXERIL) 10 MG tablet TAKE 1 TABLET TWICE DAILY AS NEEDED FOR MUSCLE SPASM(S) 90 tablet 1  . furosemide (LASIX) 20 MG tablet TAKE 1 TABLET EVERY MORNING 90 tablet 0  . gabapentin (NEURONTIN) 300 MG capsule Take 1 capsule (300 mg total) by mouth at bedtime. 30 capsule 5  . insulin detemir (LEVEMIR) 100 UNIT/ML injection Inject subcutaneously twice daily 25 units in the morning and 20 units in the evening 30 mL 5  . lisinopril (PRINIVIL,ZESTRIL) 10 MG tablet TAKE 1 TABLET DAILY. 90 tablet 0  . metFORMIN (GLUCOPHAGE) 1000 MG tablet TAKE 1 TABLET TWICE DAILY WITH A MEAL 180 tablet 0  . NOVOLOG FLEXPEN 100 UNIT/ML FlexPen INJECT 0 TO 12 UNITS THREE TIMES DAILY BEFORE MEALS AS PER SLIDING SCALE (DISCARD AND BEGIN A NEW PEN EVERY 28 DAYS) 45 mL 0  . omeprazole (PRILOSEC) 40 MG capsule TAKE 1 CAPSULE EVERY EVENING 90 capsule 0   No facility-administered medications prior to visit.     ROS Review of Systems  Constitutional: Negative for activity change, appetite change and fatigue.  HENT: Negative for congestion, sinus pressure and sore throat.   Eyes: Negative for visual disturbance.  Respiratory: Negative for cough, chest tightness, shortness  of breath and wheezing.   Cardiovascular: Negative for chest pain and palpitations.  Gastrointestinal: Negative for abdominal distention, abdominal pain and constipation.  Endocrine: Negative for polydipsia.  Genitourinary: Negative for dysuria and frequency.  Musculoskeletal: Negative for arthralgias and back pain.  Skin: Negative for rash.  Neurological: Negative for tremors, light-headedness and numbness.  Hematological: Does not bruise/bleed easily.  Psychiatric/Behavioral: Negative for agitation and behavioral problems.    Objective:  BP 131/76   Pulse 72   Temp 98.3 F (36.8  C) (Oral)   Ht '5\' 6"'$  (1.676 m)   Wt 192 lb (87.1 kg)   SpO2 100%   BMI 30.99 kg/m   BP/Weight 12/16/2016 09/08/2016 2/83/1517  Systolic BP 616 073 710  Diastolic BP 76 69 56  Wt. (Lbs) 192 180 183  BMI 30.99 29.05 29.54      Physical Exam  Constitutional: She is oriented to person, place, and time. She appears well-developed and well-nourished.  Cardiovascular: Normal rate, normal heart sounds and intact distal pulses.  No murmur heard. Pulmonary/Chest: Effort normal and breath sounds normal. She has no wheezes. She has no rales. She exhibits no tenderness.  Abdominal: Soft. Bowel sounds are normal. She exhibits no distension and no mass. There is no tenderness.  Musculoskeletal: Normal range of motion.  Neurological: She is alert and oriented to person, place, and time.  Skin: Skin is warm and dry.  Psychiatric: She has a normal mood and affect.    CMP Latest Ref Rng & Units 06/18/2016 02/22/2016 05/18/2015  Glucose 65 - 99 mg/dL 100(H) 157(H) 110(H)  BUN 6 - 20 mg/dL '11 8 11  '$ Creatinine 0.44 - 1.00 mg/dL 1.00 0.70 0.70  Sodium 135 - 145 mmol/L 143 140 143  Potassium 3.5 - 5.1 mmol/L 4.2 4.1 3.9  Chloride 101 - 111 mmol/L 110 103 110  CO2 20 - 31 mmol/L - 27 23  Calcium 8.6 - 10.4 mg/dL - 9.8 8.8(L)  Total Protein 6.1 - 8.1 g/dL - 7.5 6.3(L)  Total Bilirubin 0.2 - 1.2 mg/dL - 0.6 0.4  Alkaline Phos 33 - 130 U/L - 79 54  AST 10 - 35 U/L - 17 31  ALT 6 - 29 U/L - 20 25    Lipid Panel     Component Value Date/Time   CHOL 107 02/22/2016 0841   TRIG 57 02/22/2016 0841   HDL 56 02/22/2016 0841   CHOLHDL 1.9 02/22/2016 0841    Assessment & Plan:   1. Diabetes mellitus without complication ([place]) Controlled with A1c of 6.6 Continue medications, diabetic diet Keep blood sugar logs with fasting goals of 80-120 mg/dl, random of less than 180 and in the event of sugars less than 60 mg/dl or greater than 400 mg/dl please notify the clinic ASAP. It is recommended that you  undergo annual eye exams and annual foot exams. Pneumovax is recommended every 5 years before the age of 52 and once for a lifetime at or after the age of 52. - POCT glucose (manual entry) - POCT glycosylated hemoglobin (Hb A1C) - Microalbumin/Creatinine Ratio, Urine - CMP14+EGFR - insulin aspart (NOVOLOG FLEXPEN) 100 UNIT/ML FlexPen; INJECT 0 TO 12 UNITS THREE TIMES DAILY BEFORE MEALS AS PER SLIDING SCALE (DISCARD AND BEGIN A NEW PEN EVERY 28 DAYS)  Dispense: 45 mL; Refill: 1 - metFORMIN (GLUCOPHAGE) 1000 MG tablet; TAKE 1 TABLET TWICE DAILY WITH A MEAL  Dispense: 180 tablet; Refill: 1 - insulin detemir (LEVEMIR) 100 UNIT/ML injection; Inject subcutaneously twice daily 25 units  in the morning and 20 units in the evening  Dispense: 30 mL; Refill: 5  2. Peptic ulcer disease Controlled - omeprazole (PRILOSEC) 40 MG capsule; TAKE 1 CAPSULE EVERY EVENING  Dispense: 90 capsule; Refill: 1  3. Essential hypertension Controlled Low-sodium diet - lisinopril (PRINIVIL,ZESTRIL) 10 MG tablet; Take 1 tablet (10 mg total) daily by mouth.  Dispense: 90 tablet; Refill: 1 - furosemide (LASIX) 20 MG tablet; Take 1 tablet (20 mg total) every morning by mouth.  Dispense: 90 tablet; Refill: 1  4. Lipid disorder Controlled - atorvastatin (LIPITOR) 20 MG tablet; Take 1 tablet (20 mg total) daily by mouth.  Dispense: 90 tablet; Refill: 1  5. Type 2 diabetes mellitus without complication, with long-term current use of insulin ([facility])  6. Other diabetic neurological complication associated with type 2 diabetes mellitus ([facility]) Controlled on gabapentin - cyclobenzaprine (FLEXERIL) 10 MG tablet; TAKE 1 TABLET TWICE DAILY AS NEEDED FOR MUSCLE SPASM(S)  Dispense: 90 tablet; Refill: 1 - gabapentin (NEURONTIN) 300 MG capsule; Take 1 capsule (300 mg total) at bedtime by mouth.  Dispense: 90 capsule; Refill: 1  7. Screening for viral disease - HIV antibody (with reflex) - Hepatitis c antibody (reflex)  8. Need for  influenza vaccination  9. Obesity (BMI 30-39.9) Discussed increasing physical activity to 150 minutes/week Reduce portion sizes and caloric intake   Meds ordered this encounter  Medications  . omeprazole (PRILOSEC) 40 MG capsule    Sig: TAKE 1 CAPSULE EVERY EVENING    Dispense:  90 capsule    Refill:  1  . insulin aspart (NOVOLOG FLEXPEN) 100 UNIT/ML FlexPen    Sig: INJECT 0 TO 12 UNITS THREE TIMES DAILY BEFORE MEALS AS PER SLIDING SCALE (DISCARD AND BEGIN A NEW PEN EVERY 28 DAYS)    Dispense:  45 mL    Refill:  1  . metFORMIN (GLUCOPHAGE) 1000 MG tablet    Sig: TAKE 1 TABLET TWICE DAILY WITH A MEAL    Dispense:  180 tablet    Refill:  1  . lisinopril (PRINIVIL,ZESTRIL) 10 MG tablet    Sig: Take 1 tablet (10 mg total) daily by mouth.    Dispense:  90 tablet    Refill:  1  . insulin detemir (LEVEMIR) 100 UNIT/ML injection    Sig: Inject subcutaneously twice daily 25 units in the morning and 20 units in the evening    Dispense:  30 mL    Refill:  5    Discontinue previous dose  . furosemide (LASIX) 20 MG tablet    Sig: Take 1 tablet (20 mg total) every morning by mouth.    Dispense:  90 tablet    Refill:  1  . cyclobenzaprine (FLEXERIL) 10 MG tablet    Sig: TAKE 1 TABLET TWICE DAILY AS NEEDED FOR MUSCLE SPASM(S)    Dispense:  90 tablet    Refill:  1  . atorvastatin (LIPITOR) 20 MG tablet    Sig: Take 1 tablet (20 mg total) daily by mouth.    Dispense:  90 tablet    Refill:  1  . gabapentin (NEURONTIN) 300 MG capsule    Sig: Take 1 capsule (300 mg total) at bedtime by mouth.    Dispense:  90 capsule    Refill:  1    Follow-up: Return in about 3 months (around 03/18/2017) for follow up on chronic medical conditions.   Arnoldo Morale MD

## 2016-12-16 NOTE — Patient Instructions (Signed)

## 2016-12-17 LAB — CMP14+EGFR
ALBUMIN: 4 g/dL (ref 3.5–5.5)
ALT: 48 IU/L — ABNORMAL HIGH (ref 0–32)
AST: 47 IU/L — ABNORMAL HIGH (ref 0–40)
Albumin/Globulin Ratio: 1.7 (ref 1.2–2.2)
Alkaline Phosphatase: 76 IU/L (ref 39–117)
BUN / CREAT RATIO: 12 (ref 9–23)
BUN: 12 mg/dL (ref 6–24)
CALCIUM: 8.9 mg/dL (ref 8.7–10.2)
CO2: 24 mmol/L (ref 20–29)
CREATININE: 0.97 mg/dL (ref 0.57–1.00)
Chloride: 112 mmol/L — ABNORMAL HIGH (ref 96–106)
GFR, EST AFRICAN AMERICAN: 75 mL/min/{1.73_m2} (ref >59)
GFR, EST NON AFRICAN AMERICAN: 65 mL/min/{1.73_m2} (ref >59)
GLUCOSE: 69 mg/dL (ref 65–99)
Globulin, Total: 2.4 g/dL (ref 1.5–4.5)
Potassium: 4.4 mmol/L (ref 3.5–5.2)
Sodium: 147 mmol/L — ABNORMAL HIGH (ref 134–144)
TOTAL PROTEIN: 6.4 g/dL (ref 6.0–8.5)

## 2016-12-17 LAB — HIV ANTIBODY (ROUTINE TESTING W REFLEX): HIV Screen 4th Generation wRfx: NONREACTIVE

## 2016-12-17 LAB — HEPATITIS C ANTIBODY (REFLEX): HCV Ab: <0.1 s/co ratio (ref 0.0–0.9)

## 2016-12-17 LAB — HCV COMMENT:

## 2016-12-19 ENCOUNTER — Telehealth: Payer: Self-pay

## 2016-12-19 NOTE — Telephone Encounter (Signed)
Pt was called and informed of lab results. 

## 2017-03-20 ENCOUNTER — Encounter: Payer: Self-pay | Admitting: Family Medicine

## 2017-03-20 ENCOUNTER — Ambulatory Visit: Payer: Medicare HMO | Attending: Family Medicine | Admitting: Family Medicine

## 2017-03-20 VITALS — BP 116/72 | HR 87 | Temp 98.1°F | Ht 66.0 in | Wt 196.4 lb

## 2017-03-20 DIAGNOSIS — Z90710 Acquired absence of both cervix and uterus: Secondary | ICD-10-CM | POA: Diagnosis not present

## 2017-03-20 DIAGNOSIS — Z86718 Personal history of other venous thrombosis and embolism: Secondary | ICD-10-CM | POA: Diagnosis not present

## 2017-03-20 DIAGNOSIS — I10 Essential (primary) hypertension: Secondary | ICD-10-CM

## 2017-03-20 DIAGNOSIS — Z9884 Bariatric surgery status: Secondary | ICD-10-CM | POA: Diagnosis not present

## 2017-03-20 DIAGNOSIS — R0982 Postnasal drip: Secondary | ICD-10-CM

## 2017-03-20 DIAGNOSIS — K279 Peptic ulcer, site unspecified, unspecified as acute or chronic, without hemorrhage or perforation: Secondary | ICD-10-CM

## 2017-03-20 DIAGNOSIS — E789 Disorder of lipoprotein metabolism, unspecified: Secondary | ICD-10-CM | POA: Diagnosis not present

## 2017-03-20 DIAGNOSIS — Z794 Long term (current) use of insulin: Secondary | ICD-10-CM | POA: Diagnosis not present

## 2017-03-20 DIAGNOSIS — K219 Gastro-esophageal reflux disease without esophagitis: Secondary | ICD-10-CM | POA: Insufficient documentation

## 2017-03-20 DIAGNOSIS — E1149 Type 2 diabetes mellitus with other diabetic neurological complication: Secondary | ICD-10-CM | POA: Diagnosis not present

## 2017-03-20 DIAGNOSIS — E1140 Type 2 diabetes mellitus with diabetic neuropathy, unspecified: Secondary | ICD-10-CM | POA: Insufficient documentation

## 2017-03-20 DIAGNOSIS — E1169 Type 2 diabetes mellitus with other specified complication: Secondary | ICD-10-CM | POA: Diagnosis not present

## 2017-03-20 DIAGNOSIS — F329 Major depressive disorder, single episode, unspecified: Secondary | ICD-10-CM | POA: Insufficient documentation

## 2017-03-20 DIAGNOSIS — E119 Type 2 diabetes mellitus without complications: Secondary | ICD-10-CM

## 2017-03-20 DIAGNOSIS — F419 Anxiety disorder, unspecified: Secondary | ICD-10-CM | POA: Insufficient documentation

## 2017-03-20 DIAGNOSIS — Z79899 Other long term (current) drug therapy: Secondary | ICD-10-CM | POA: Diagnosis not present

## 2017-03-20 DIAGNOSIS — Z9049 Acquired absence of other specified parts of digestive tract: Secondary | ICD-10-CM | POA: Diagnosis not present

## 2017-03-20 DIAGNOSIS — Z791 Long term (current) use of non-steroidal anti-inflammatories (NSAID): Secondary | ICD-10-CM | POA: Diagnosis not present

## 2017-03-20 DIAGNOSIS — Z9889 Other specified postprocedural states: Secondary | ICD-10-CM | POA: Diagnosis not present

## 2017-03-20 LAB — GLUCOSE, POCT (MANUAL RESULT ENTRY): POC GLUCOSE: 177 mg/dL — AB (ref 70–99)

## 2017-03-20 MED ORDER — LISINOPRIL 10 MG PO TABS: 10.0000 mg | ORAL_TABLET | Freq: Every day | ORAL | 1 refills | Status: DC

## 2017-03-20 MED ORDER — INSULIN DETEMIR 100 UNIT/ML ~~LOC~~ SOLN: SUBCUTANEOUS | 5 refills | Status: DC

## 2017-03-20 MED ORDER — ATORVASTATIN CALCIUM 20 MG PO TABS: 20.0000 mg | ORAL_TABLET | Freq: Every day | ORAL | 1 refills | Status: DC

## 2017-03-20 MED ORDER — GABAPENTIN 300 MG PO CAPS: 300.0000 mg | ORAL_CAPSULE | Freq: Every day | ORAL | 1 refills | Status: DC

## 2017-03-20 MED ORDER — METFORMIN HCL 1000 MG PO TABS: ORAL_TABLET | ORAL | 1 refills | Status: DC

## 2017-03-20 MED ORDER — FUROSEMIDE 20 MG PO TABS: 20.0000 mg | ORAL_TABLET | Freq: Every morning | ORAL | 1 refills | Status: DC

## 2017-03-20 MED ORDER — OMEPRAZOLE 40 MG PO CPDR: 40.0000 mg | DELAYED_RELEASE_CAPSULE | Freq: Two times a day (BID) | ORAL | 1 refills | Status: DC

## 2017-03-20 MED ORDER — CETIRIZINE HCL 10 MG PO TABS: 10.0000 mg | ORAL_TABLET | Freq: Every day | ORAL | 1 refills | Status: AC

## 2017-03-20 NOTE — Patient Instructions (Signed)
Food Choices for Gastroesophageal Reflux Disease, Adult When you have gastroesophageal reflux disease (GERD), the foods you eat and your eating habits are very important. Choosing the right foods can help ease your discomfort. What guidelines do I need to follow?  Choose fruits, vegetables, whole grains, and low-fat dairy products.  Choose low-fat meat, fish, and poultry.  Limit fats such as oils, salad dressings, butter, nuts, and avocado.  Keep a food diary. This helps you identify foods that cause symptoms.  Avoid foods that cause symptoms. These may be different for everyone.  Eat small meals often instead of 3 large meals a day.  Eat your meals slowly, in a place where you are relaxed.  Limit fried foods.  Cook foods using methods other than frying.  Avoid drinking alcohol.  Avoid drinking large amounts of liquids with your meals.  Avoid bending over or lying down until 2-3 hours after eating. What foods are not recommended? These are some foods and drinks that may make your symptoms worse: Vegetables  Tomatoes. Tomato juice. Tomato and spaghetti sauce. Chili peppers. Onion and garlic. Horseradish. Fruits  Oranges, grapefruit, and lemon (fruit and juice). Meats  High-fat meats, fish, and poultry. This includes hot dogs, ribs, ham, sausage, salami, and bacon. Dairy  Whole milk and chocolate milk. Sour cream. Cream. Butter. Ice cream. Cream cheese. Drinks  Coffee and tea. Bubbly (carbonated) drinks or energy drinks. Condiments  Hot sauce. Barbecue sauce. Sweets/Desserts  Chocolate and cocoa. Donuts. Peppermint and spearmint. Fats and Oils  High-fat foods. This includes French fries and potato chips. Other  Vinegar. Strong spices. This includes black pepper, white pepper, red pepper, cayenne, curry powder, cloves, ginger, and chili powder. The items listed above may not be a complete list of foods and drinks to avoid. Contact your dietitian for more information.    This information is not intended to replace advice given to you by your health care provider. Make sure you discuss any questions you have with your health care provider. Document Released: 07/26/2011 Document Revised: 07/02/2015 Document Reviewed: 11/28/2012 Elsevier Interactive Patient Education  2017 [employer]  

## 2017-03-20 NOTE — Progress Notes (Signed)
Subjective:  Patient ID: Chelsea Rodriguez, female    DOB: 1960-10-01  Age: 57 y.o. MRN: [id number]  CC: Diabetes   HPI Chelsea Rodriguez is a 57 year old female with a history of type 2 diabetes mellitus (A1c 6.6), hypertension, dyslipidemia, peptic ulcer disease, previous DVT who comes in for follow-up of her diabetes mellitus.  She informs me she had bilateral DVT in the mid 90s and was treated with aspirin at the time as she was unable to be placed on anticoagulation due to peptic ulcer disease. Today she complains peptic ulcer symptoms are not entirely controlled despite taking omeprazole in the mornings she continues to experience reflux.  She denies terminal pain, nausea, vomiting or diarrhea.  With regards to her diabetes mellitus she denies hypoglycemia or visual concerns and fasting sugars have been in the low 100s.  Her diabetic neuropathy is currently controlled on gabapentin. She tolerates her statin with no complaints of myalgias and is doing well on her antihypertensive with no complaints of adverse effects.  Past Medical History:  Diagnosis Date  . Anxiety   . Depression   . Diabetes mellitus without complication ([place])   . H/O mammogram 2016  . Hypertension   . Hypertension   . Lipid disorder   . Peptic ulcer disease 1996    Past Surgical History:  Procedure Laterality Date  . ABDOMINAL HYSTERECTOMY  2005  . CHOLECYSTECTOMY  2005  . COLONOSCOPY  2014  . GASTRIC BYPASS  2014    No Known Allergies   Outpatient Medications Prior to Visit  Medication Sig Dispense Refill  . B-D UF III MINI PEN NEEDLES 31G X 5 MM MISC USE  AS  DIRECTED 450 each 5  . carbamide peroxide (DEBROX) 6.5 % otic solution Place 5 drops into both ears 2 (two) times daily. 15 mL 0  . cyclobenzaprine (FLEXERIL) 10 MG tablet TAKE 1 TABLET TWICE DAILY AS NEEDED FOR MUSCLE SPASM(S) 90 tablet 1  . glucose blood (ACCU-CHEK AVIVA) test strip Use 3 times daily before meals 100 each 12  . insulin  aspart (NOVOLOG FLEXPEN) 100 UNIT/ML FlexPen INJECT 0 TO 12 UNITS THREE TIMES DAILY BEFORE MEALS AS PER SLIDING SCALE (DISCARD AND BEGIN A NEW PEN EVERY 28 DAYS) 45 mL 1  . Lancet Devices (ACCU-CHEK SOFTCLIX) lancets Use 3 times daily before meals 1 each 5  . naproxen (NAPROSYN) 250 MG tablet TAKE 1 TABLET TWICE DAILY WITH MEALS 180 tablet 1  . atorvastatin (LIPITOR) 20 MG tablet Take 1 tablet (20 mg total) daily by mouth. 90 tablet 1  . cetirizine (ZYRTEC) 10 MG tablet Take 1 tablet (10 mg total) by mouth daily. 90 tablet 1  . furosemide (LASIX) 20 MG tablet Take 1 tablet (20 mg total) every morning by mouth. 90 tablet 1  . gabapentin (NEURONTIN) 300 MG capsule Take 1 capsule (300 mg total) at bedtime by mouth. 90 capsule 1  . insulin detemir (LEVEMIR) 100 UNIT/ML injection Inject subcutaneously twice daily 25 units in the morning and 20 units in the evening 30 mL 5  . lisinopril (PRINIVIL,ZESTRIL) 10 MG tablet Take 1 tablet (10 mg total) daily by mouth. 90 tablet 1  . metFORMIN (GLUCOPHAGE) 1000 MG tablet TAKE 1 TABLET TWICE DAILY WITH A MEAL 180 tablet 1  . omeprazole (PRILOSEC) 40 MG capsule TAKE 1 CAPSULE EVERY EVENING 90 capsule 1   No facility-administered medications prior to visit.     ROS Review of Systems  Constitutional: Negative for activity change, appetite change  and fatigue.  HENT: Negative for congestion, sinus pressure and sore throat.   Eyes: Negative for visual disturbance.  Respiratory: Negative for cough, chest tightness, shortness of breath and wheezing.   Cardiovascular: Negative for chest pain and palpitations.  Gastrointestinal: Negative for abdominal distention, abdominal pain and constipation.  Endocrine: Negative for polydipsia.  Genitourinary: Negative for dysuria and frequency.  Musculoskeletal: Negative for arthralgias and back pain.  Skin: Negative for rash.  Neurological: Negative for tremors, light-headedness and numbness.  Hematological: Does not  bruise/bleed easily.  Psychiatric/Behavioral: Negative for agitation and behavioral problems.    Objective:  BP 116/72   Pulse 87   Temp 98.1 F (36.7 C) (Oral)   Ht 5' 6" (1.676 m)   Wt 196 lb 6.4 oz (89.1 kg)   SpO2 100%   BMI 31.70 kg/m   BP/Weight 03/20/2017 24/05/100 08/08/5364  Systolic BP 440 347 425  Diastolic BP 72 76 69  Wt. (Lbs) 196.4 192 180  BMI 31.7 30.99 29.05     Physical Exam  Constitutional: She is oriented to person, place, and time. She appears well-developed and well-nourished.  Cardiovascular: Normal rate, normal heart sounds and intact distal pulses.  No murmur heard. Pulmonary/Chest: Effort normal and breath sounds normal. She has no wheezes. She has no rales. She exhibits no tenderness.  Abdominal: Soft. Bowel sounds are normal. She exhibits no distension and no mass. There is no tenderness.  Musculoskeletal: Normal range of motion.  Neurological: She is alert and oriented to person, place, and time.  Skin: Skin is warm and dry.  Psychiatric: She has a normal mood and affect.     CMP Latest Ref Rng & Units 12/16/2016 06/18/2016 02/22/2016  Glucose 65 - 99 mg/dL 69 100(H) 157(H)  BUN 6 - 24 mg/dL _0 Creatinine 0.57 - 1.00 mg/dL 0.97 1.00 0.70  Sodium 134 - 144 mmol/L 147(H) 143 140  Potassium 3.5 - 5.2 mmol/L 4.4 4.2 4.1  Chloride 96 - 106 mmol/L 112(H) 110 103  CO2 20 - 29 mmol/L 24 - 27  Calcium 8.7 - 10.2 mg/dL 8.9 - 9.8  Total Protein 6.0 - 8.5 g/dL 6.4 - 7.5  Total Bilirubin 0.0 - 1.2 mg/dL <0.2 - 0.6  Alkaline Phos 39 - 117 IU/L 76 - 79  AST 0 - 40 IU/L 47(H) - 17  ALT 0 - 32 IU/L 48(H) - 20    Lipid Panel     Component Value Date/Time   CHOL 107 02/22/2016 0841   TRIG 57 02/22/2016 0841   HDL 56 02/22/2016 0841   CHOLHDL 1.9 02/22/2016 0841     Lab Results  Component Value Date   HGBA1C 6.6 12/16/2016    Assessment & Plan:   1. Type 2 diabetes mellitus with other specified complication, with long-term current use of  insulin (HCC) Controlled with an A1c of 6.6 Eye exam was last year-not due yet Counseled on Diabetic diet, my plate method, 956 minutes of moderate intensity exercise/week Keep blood sugar logs with fasting goals of 80-120 mg/dl, random of less than 180 and in the event of sugars less than 60 mg/dl or greater than 400 mg/dl please notify the clinic ASAP. It is recommended that you undergo annual eye exams and annual foot exams. Pneumonia vaccine is recommended. - POCT glucose (manual entry) - POCT glycosylated hemoglobin (Hb A1C) - Microalbumin/Creatinine Ratio, Urine; Future - Hemoglobin A1c; Future - Lipid panel; Future - insulin detemir (LEVEMIR) 100 UNIT/ML injection; Inject subcutaneously twice daily  25 units in the morning and 20 units in the evening  Dispense: 30 mL; Refill: 5 - metFORMIN (GLUCOPHAGE) 1000 MG tablet; TAKE 1 TABLET TWICE DAILY WITH A MEAL  Dispense: 180 tablet; Refill: 1 - CMP14+EGFR; Future  2. History of DVT (deep vein thrombosis) Treated with aspirin in the mid 90s as per patient has anticoagulation was contraindicated due to peptic ulcer No repeat episodes   3. Lipid disorder Controlled - atorvastatin (LIPITOR) 20 MG tablet; Take 1 tablet (20 mg total) by mouth daily.  Dispense: 90 tablet; Refill: 1  4. Essential hypertension Controlled Counseled on blood pressure goal of less than 130/80, low-sodium, DASH diet, medication compliance, 150 minutes of moderate intensity exercise per week. Discussed medication compliance, adverse effects. - furosemide (LASIX) 20 MG tablet; Take 1 tablet (20 mg total) by mouth every morning.  Dispense: 90 tablet; Refill: 1 - lisinopril (PRINIVIL,ZESTRIL) 10 MG tablet; Take 1 tablet (10 mg total) by mouth daily.  Dispense: 90 tablet; Refill: 1  5. Post-nasal drip Stable - cetirizine (ZYRTEC) 10 MG tablet; Take 1 tablet (10 mg total) by mouth daily.  Dispense: 90 tablet; Refill: 1  6. Peptic ulcer  disease Uncontrolled Increased omeprazole from daily to twice daily dosing We have discussed dietary modifications to prevent exacerbations - omeprazole (PRILOSEC) 40 MG capsule; Take 1 capsule (40 mg total) by mouth 2 (two) times daily.  Dispense: 180 capsule; Refill: 1  7. Other diabetic neurological complication associated with type 2 diabetes mellitus (HCC) Stable - gabapentin (NEURONTIN) 300 MG capsule; Take 1 capsule (300 mg total) by mouth at bedtime.  Dispense: 90 capsule; Refill: 1   Meds ordered this encounter  Medications  . atorvastatin (LIPITOR) 20 MG tablet    Sig: Take 1 tablet (20 mg total) by mouth daily.    Dispense:  90 tablet    Refill:  1  . cetirizine (ZYRTEC) 10 MG tablet    Sig: Take 1 tablet (10 mg total) by mouth daily.    Dispense:  90 tablet    Refill:  1  . furosemide (LASIX) 20 MG tablet    Sig: Take 1 tablet (20 mg total) by mouth every morning.    Dispense:  90 tablet    Refill:  1  . gabapentin (NEURONTIN) 300 MG capsule    Sig: Take 1 capsule (300 mg total) by mouth at bedtime.    Dispense:  90 capsule    Refill:  1  . insulin detemir (LEVEMIR) 100 UNIT/ML injection    Sig: Inject subcutaneously twice daily 25 units in the morning and 20 units in the evening    Dispense:  30 mL    Refill:  5    Discontinue previous dose  . lisinopril (PRINIVIL,ZESTRIL) 10 MG tablet    Sig: Take 1 tablet (10 mg total) by mouth daily.    Dispense:  90 tablet    Refill:  1  . metFORMIN (GLUCOPHAGE) 1000 MG tablet    Sig: TAKE 1 TABLET TWICE DAILY WITH A MEAL    Dispense:  180 tablet    Refill:  1  . omeprazole (PRILOSEC) 40 MG capsule    Sig: Take 1 capsule (40 mg total) by mouth 2 (two) times daily.    Dispense:  180 capsule    Refill:  1    Follow-up: Return in about 3 months (around 06/17/2017) for Follow-up of chronic medical conditions.   Charlott Rakes MD

## 2017-03-23 ENCOUNTER — Ambulatory Visit: Payer: Medicare HMO | Attending: Family Medicine

## 2017-03-23 DIAGNOSIS — Z794 Long term (current) use of insulin: Secondary | ICD-10-CM | POA: Diagnosis not present

## 2017-03-23 DIAGNOSIS — E1169 Type 2 diabetes mellitus with other specified complication: Secondary | ICD-10-CM

## 2017-03-23 NOTE — Progress Notes (Signed)
Patient here for lab visit only 

## 2017-03-24 LAB — CMP14+EGFR
ALK PHOS: 76 IU/L (ref 39–117)
ALT: 33 IU/L — ABNORMAL HIGH (ref 0–32)
AST: 29 IU/L (ref 0–40)
Albumin/Globulin Ratio: 1.6 (ref 1.2–2.2)
Albumin: 3.9 g/dL (ref 3.5–5.5)
BUN/Creatinine Ratio: 14 (ref 9–23)
BUN: 13 mg/dL (ref 6–24)
Bilirubin Total: 0.2 mg/dL (ref 0.0–1.2)
CO2: 23 mmol/L (ref 20–29)
CREATININE: 0.91 mg/dL (ref 0.57–1.00)
Calcium: 9.4 mg/dL (ref 8.7–10.2)
Chloride: 111 mmol/L — ABNORMAL HIGH (ref 96–106)
GFR calc Af Amer: 81 mL/min/{1.73_m2} (ref >59)
GFR calc non Af Amer: 70 mL/min/{1.73_m2} (ref >59)
GLUCOSE: 96 mg/dL (ref 65–99)
Globulin, Total: 2.4 g/dL (ref 1.5–4.5)
Potassium: 5 mmol/L (ref 3.5–5.2)
SODIUM: 145 mmol/L — AB (ref 134–144)
Total Protein: 6.3 g/dL (ref 6.0–8.5)

## 2017-03-24 LAB — LIPID PANEL
CHOLESTEROL TOTAL: 97 mg/dL — AB (ref 100–199)
Chol/HDL Ratio: 2 ratio (ref 0.0–4.4)
HDL: 49 mg/dL (ref >39)
LDL CALC: 37 mg/dL (ref 0–99)
Triglycerides: 54 mg/dL (ref 0–149)
VLDL Cholesterol Cal: 11 mg/dL (ref 5–40)

## 2017-03-24 LAB — HEMOGLOBIN A1C
Est. average glucose Bld gHb Est-mCnc: 163 mg/dL
Hgb A1c MFr Bld: 7.3 % — ABNORMAL HIGH (ref 4.8–5.6)

## 2017-03-30 ENCOUNTER — Telehealth: Payer: Self-pay

## 2017-03-30 NOTE — Telephone Encounter (Signed)
Patient was called and informed of lab results. 

## 2017-03-31 ENCOUNTER — Telehealth: Payer: Self-pay | Admitting: Family Medicine

## 2017-03-31 DIAGNOSIS — E119 Type 2 diabetes mellitus without complications: Secondary | ICD-10-CM

## 2017-03-31 NOTE — Telephone Encounter (Signed)
Pt called to request a refill for glucose blood (ACCU-CHEK AVIVA) test strip  insulin aspart (NOVOLOG FLEXPEN) 100 UNIT/ML FlexPen And the needles  Lancet Devices (ACCU-CHEK SOFTCLIX) lancets Please sent it to [facility] [place], [place] - [phone] Please follow up

## 2017-04-27 MED ORDER — ACCU-CHEK SOFTCLIX LANCETS MISC: 12 refills | Status: DC

## 2017-04-27 MED ORDER — GLUCOSE BLOOD VI STRP: ORAL_STRIP | 12 refills | Status: DC

## 2017-04-27 MED ORDER — INSULIN ASPART 100 UNIT/ML FLEXPEN: PEN_INJECTOR | SUBCUTANEOUS | 0 refills | Status: DC

## 2017-04-27 MED ORDER — INSULIN PEN NEEDLE 31G X 5 MM MISC: 0 refills | Status: DC

## 2017-04-27 NOTE — Telephone Encounter (Signed)
Pt called to request a refill for glucose blood (ACCU-CHEK AVIVA) test strip  insulin aspart (NOVOLOG FLEXPEN) 100 UNIT/ML FlexPen And the needles             Lancet Devices (ACCU-CHEK SOFTCLIX) lancets Please sent it to [facility] [place] - [phone] Please follow up

## 2017-04-27 NOTE — Telephone Encounter (Signed)
[facility] supplies and [facility]

## 2017-06-01 ENCOUNTER — Other Ambulatory Visit: Payer: Self-pay | Admitting: Family Medicine

## 2017-06-01 DIAGNOSIS — Z1231 Encounter for screening mammogram for malignant neoplasm of breast: Secondary | ICD-10-CM

## 2017-06-10 IMAGING — US US ABDOMEN COMPLETE
1 series · 14 of 25 positions shown · non-contrast
Comparison: None.

CLINICAL DATA: Left-sided flank pain.

EXAM:
ABDOMEN ULTRASOUND COMPLETE

[Series 1: us abdomen complete · 0.26mm/px · 14 of 69 slices shown]
[im 1/69]
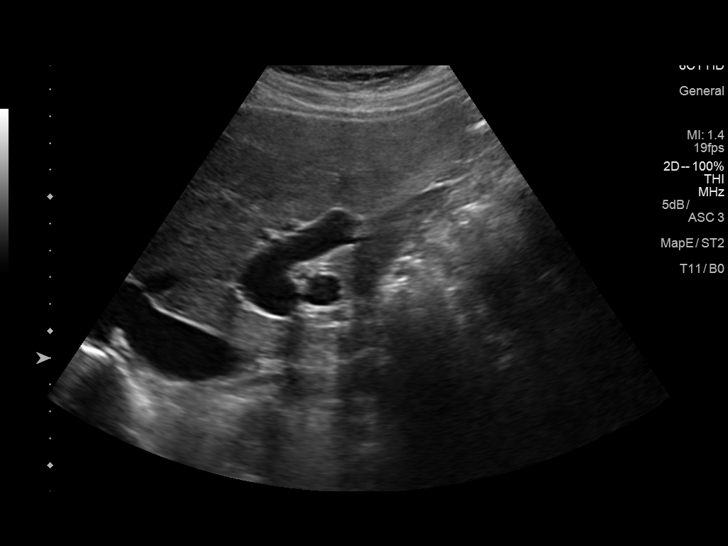
[im 6/69]
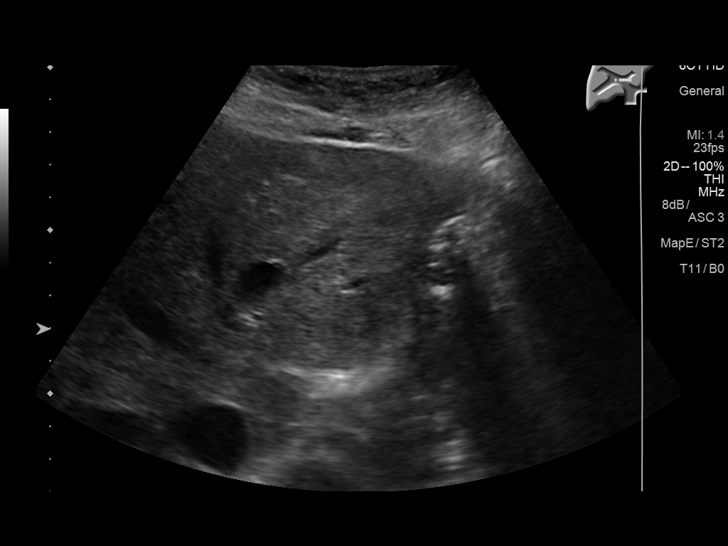
[im 12/69]
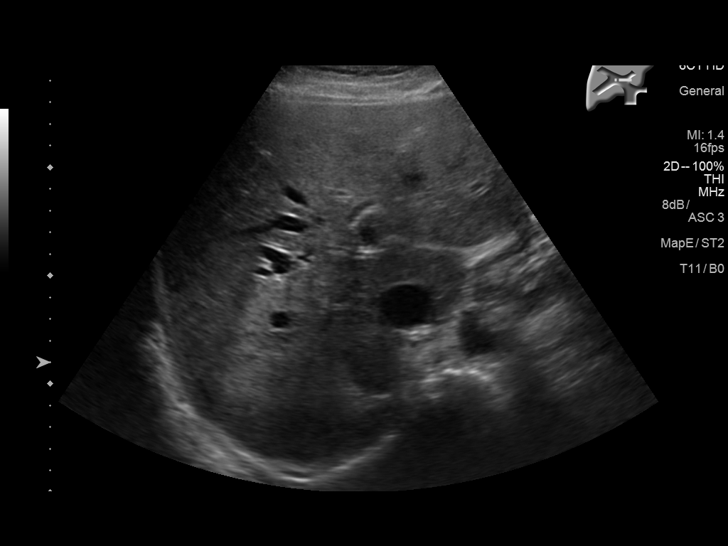
[im 18/69]
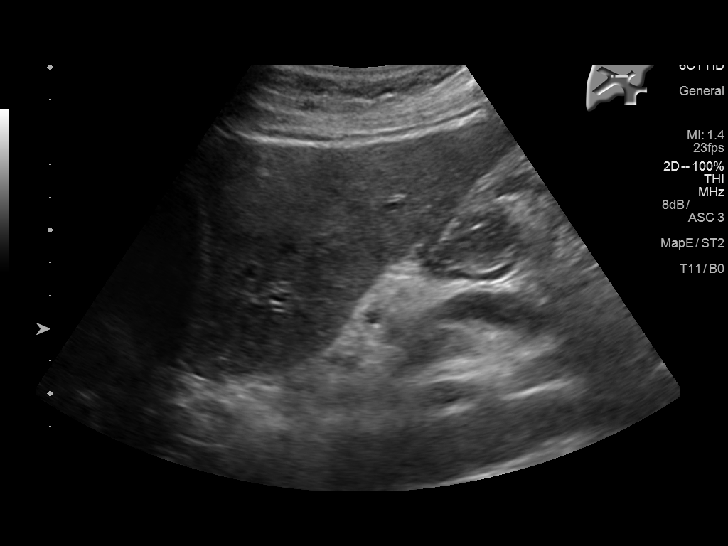
[im 23/69]
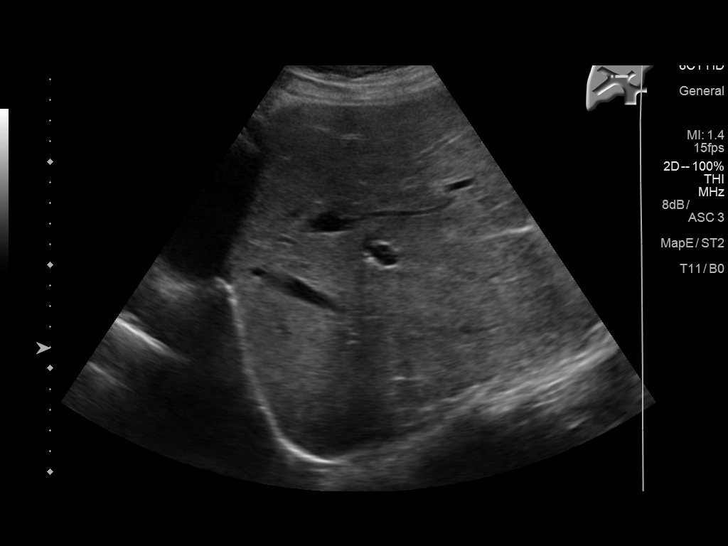
[im 26/69]
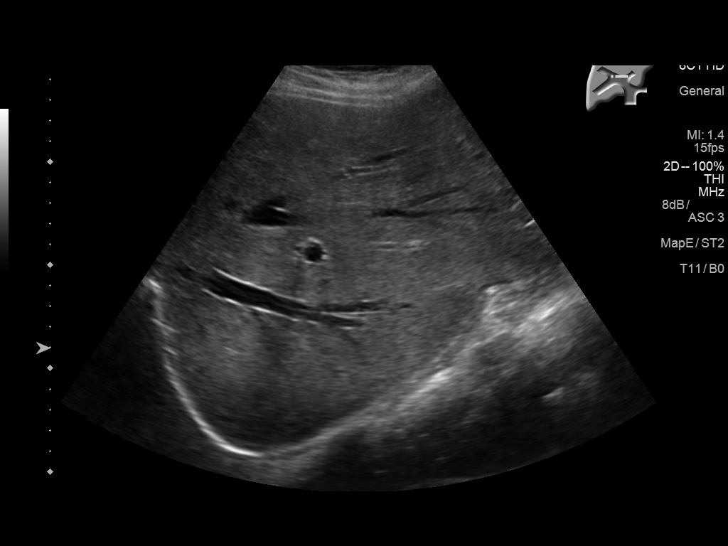
[im 32/69]
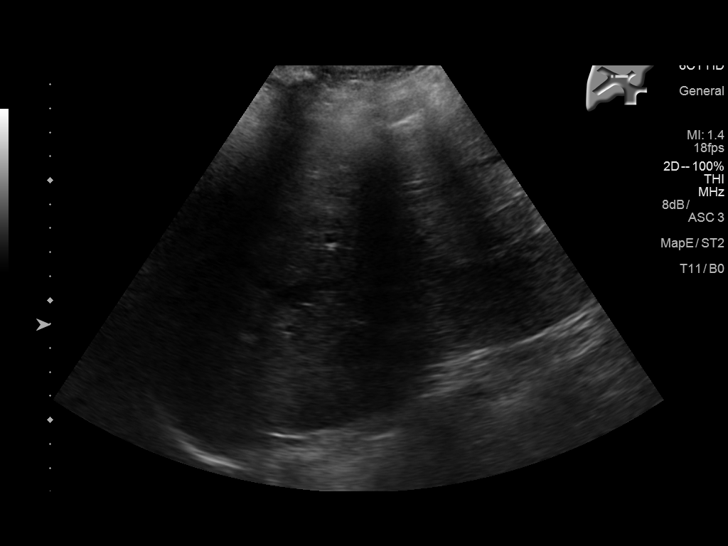
[im 37/69]
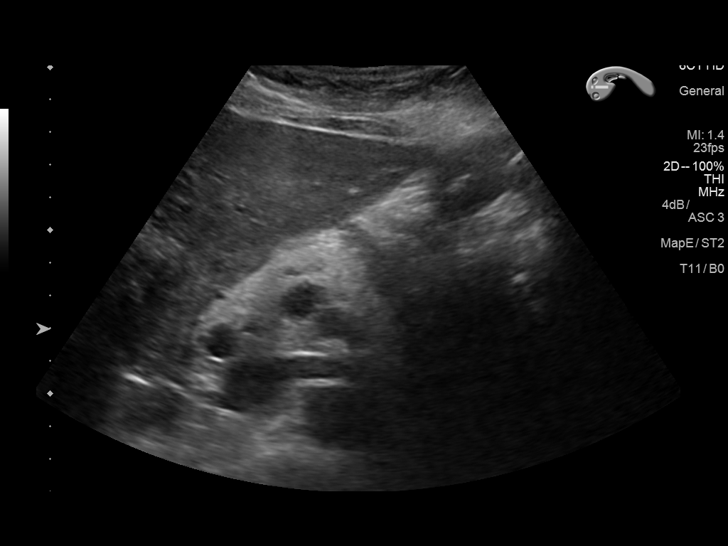
[im 43/69]
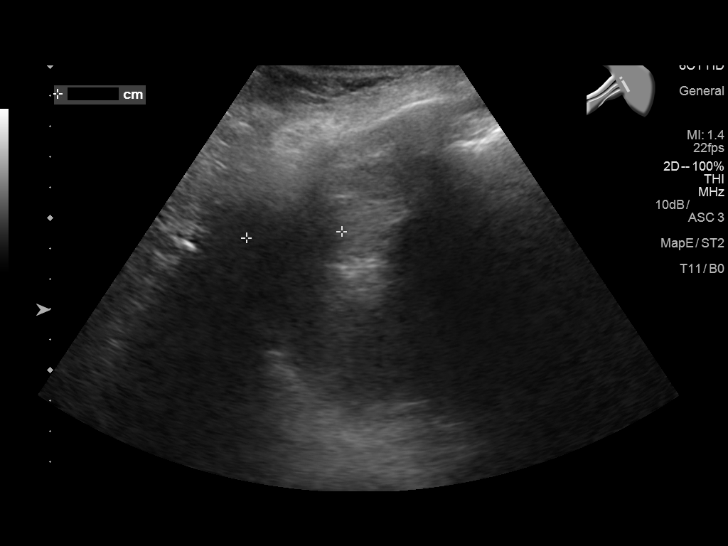
[im 46/69]
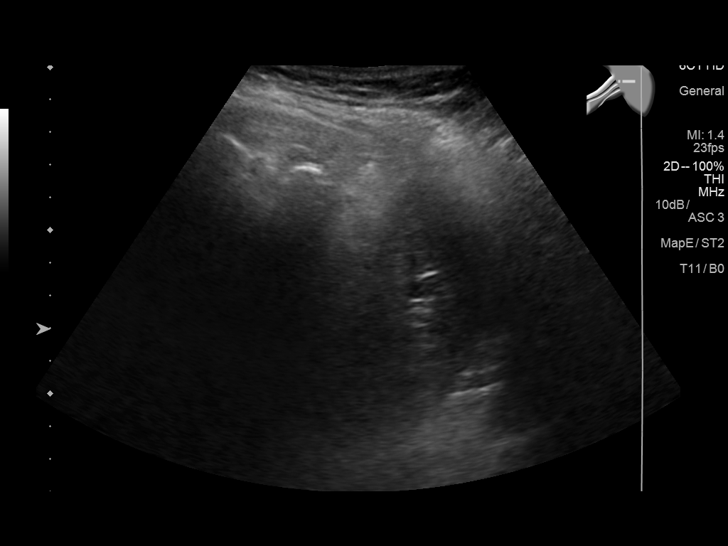
[im 52/69]
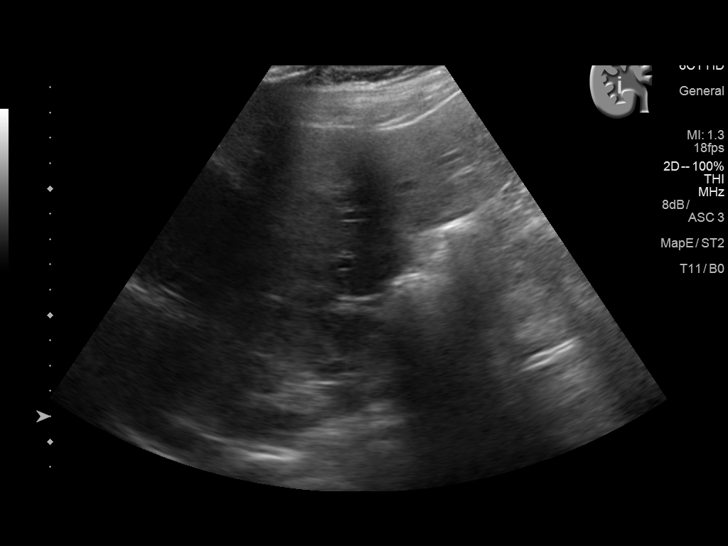
[im 57/69]
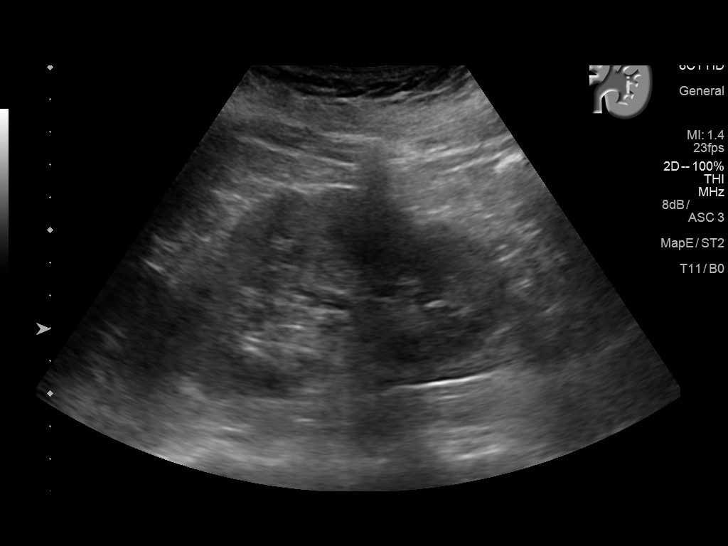
[im 63/69]
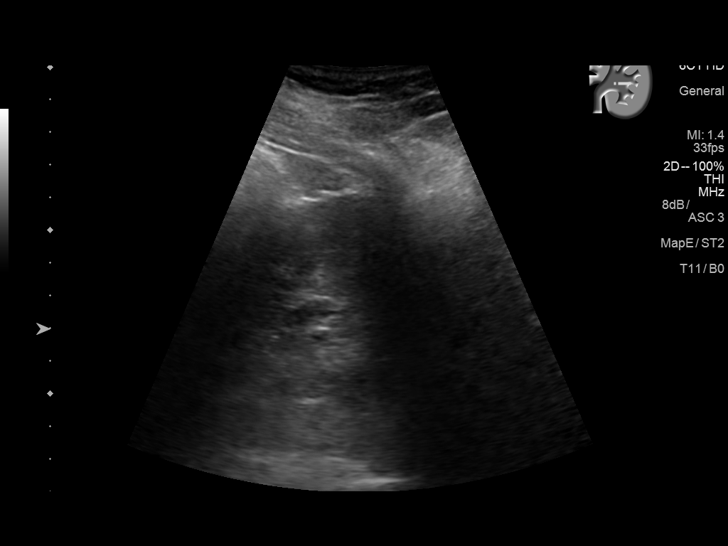
[im 69/69]
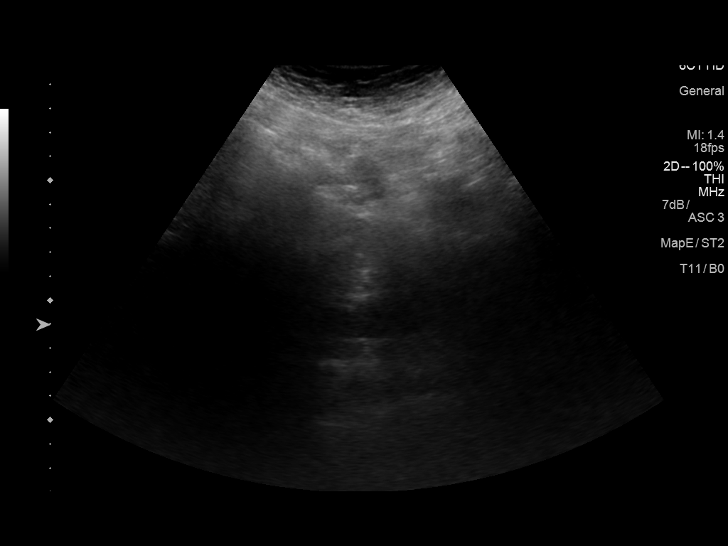

[14 of 25 positions shown; findings below may reference images not displayed]

FINDINGS: Gallbladder: Surgically absent.

Common bile duct: Diameter: Maximum diameter of approximately 11 mm.
Dilatation may be on the basis of prior cholecystectomy. No obvious
evidence of choledocholithiasis by ultrasound.

Liver: No focal lesion identified. Within normal limits in
parenchymal echogenicity.

IVC: No abnormality visualized.

Pancreas: Visualized portion unremarkable.

Spleen: Size and appearance within normal limits.

Right Kidney: Length: 10.7 cm. Echogenicity within normal limits. No
mass or hydronephrosis visualized.

Left Kidney: Length: 10.5 cm. Echogenicity within normal limits. No
mass or hydronephrosis visualized.

Abdominal aorta: No aneurysm visualized.

Other findings: None.
IMPRESSION: Dilatation of the common bile duct post cholecystectomy. No
ultrasound evidence of choledocholithiasis.

## 2017-06-16 ENCOUNTER — Ambulatory Visit: Payer: Medicare HMO | Attending: Family Medicine | Admitting: Family Medicine

## 2017-06-16 ENCOUNTER — Encounter: Payer: Self-pay | Admitting: Family Medicine

## 2017-06-16 VITALS — BP 108/70 | HR 69 | Temp 98.1°F | Ht 66.0 in | Wt 194.2 lb

## 2017-06-16 DIAGNOSIS — I10 Essential (primary) hypertension: Secondary | ICD-10-CM

## 2017-06-16 DIAGNOSIS — Z79899 Other long term (current) drug therapy: Secondary | ICD-10-CM | POA: Insufficient documentation

## 2017-06-16 DIAGNOSIS — Z9049 Acquired absence of other specified parts of digestive tract: Secondary | ICD-10-CM | POA: Diagnosis not present

## 2017-06-16 DIAGNOSIS — E1149 Type 2 diabetes mellitus with other diabetic neurological complication: Secondary | ICD-10-CM

## 2017-06-16 DIAGNOSIS — K279 Peptic ulcer, site unspecified, unspecified as acute or chronic, without hemorrhage or perforation: Secondary | ICD-10-CM | POA: Diagnosis not present

## 2017-06-16 DIAGNOSIS — E119 Type 2 diabetes mellitus without complications: Secondary | ICD-10-CM | POA: Diagnosis not present

## 2017-06-16 DIAGNOSIS — E7800 Pure hypercholesterolemia, unspecified: Secondary | ICD-10-CM

## 2017-06-16 DIAGNOSIS — Z90710 Acquired absence of both cervix and uterus: Secondary | ICD-10-CM | POA: Insufficient documentation

## 2017-06-16 DIAGNOSIS — Z794 Long term (current) use of insulin: Secondary | ICD-10-CM | POA: Insufficient documentation

## 2017-06-16 LAB — POCT GLYCOSYLATED HEMOGLOBIN (HGB A1C): HEMOGLOBIN A1C: 6.9

## 2017-06-16 LAB — GLUCOSE, POCT (MANUAL RESULT ENTRY): POC Glucose: 121 mg/dl — AB (ref 70–99)

## 2017-06-16 MED ORDER — GABAPENTIN 300 MG PO CAPS: 300.0000 mg | ORAL_CAPSULE | Freq: Two times a day (BID) | ORAL | 1 refills | Status: DC

## 2017-06-16 MED ORDER — CYCLOBENZAPRINE HCL 10 MG PO TABS: ORAL_TABLET | ORAL | 1 refills | Status: AC

## 2017-06-16 NOTE — Progress Notes (Signed)
Subjective:  Patient ID: Chelsea Rodriguez, female    DOB: [birth year]  Age: 57 y.o. MRN: [id number]  CC: Diabetes   HPI Evone Douds is a 57 year old female with a history of type 2 diabetes mellitus (A1c 6.9), hypertension, dyslipidemia, peptic ulcer disease, previous DVT who comes in for follow-up of her diabetes mellitus. She complains of numbness in her feet on prolonged standing as she is on her feet for 8 hours /day at her job at [employer]. She is doing well on Levemir and metformin and denies hypoglycemia or visual concerns. Doing well on her antihypertensive and denies adverse effects. Also tolerating her statin and reflux symptoms are controlled. She denies additional concerns.  Past Medical History:  Diagnosis Date  . Anxiety   . Depression   . Diabetes mellitus without complication (HCC)   . H/O mammogram 2016  . Hypertension   . Hypertension   . Lipid disorder   . Peptic ulcer disease 1996    Past Surgical History:  Procedure Laterality Date  . ABDOMINAL HYSTERECTOMY  2005  . CHOLECYSTECTOMY  2005  . COLONOSCOPY  2014  . GASTRIC BYPASS  2014    No Known Allergies   Outpatient Medications Prior to Visit  Medication Sig Dispense Refill  . ACCU-CHEK SOFTCLIX LANCETS lancets Use as instructed 3 times daily before meals. E11.9 100 each 12  . atorvastatin (LIPITOR) 20 MG tablet Take 1 tablet (20 mg total) by mouth daily. 90 tablet 1  . carbamide peroxide (DEBROX) 6.5 % otic solution Place 5 drops into both ears 2 (two) times daily. 15 mL 0  . cetirizine (ZYRTEC) 10 MG tablet Take 1 tablet (10 mg total) by mouth daily. 90 tablet 1  . furosemide (LASIX) 20 MG tablet Take 1 tablet (20 mg total) by mouth every morning. 90 tablet 1  . glucose blood (ACCU-CHEK AVIVA) test strip Use 3 times daily before meals. E11.9 100 each 12  . insulin aspart (NOVOLOG FLEXPEN) 100 UNIT/ML FlexPen INJECT 0 TO 12 UNITS THREE TIMES DAILY BEFORE MEALS AS PER SLIDING SCALE (DISCARD  AND BEGIN A NEW PEN EVERY 28 DAYS) 45 mL 0  . insulin detemir (LEVEMIR) 100 UNIT/ML injection Inject subcutaneously twice daily 25 units in the morning and 20 units in the evening 30 mL 5  . Insulin Pen Needle (B-D UF III MINI PEN NEEDLES) 31G X 5 MM MISC USE  AS  DIRECTED with insulin 450 each 0  . Lancet Devices (ACCU-CHEK SOFTCLIX) lancets Use 3 times daily before meals 1 each 5  . lisinopril (PRINIVIL,ZESTRIL) 10 MG tablet Take 1 tablet (10 mg total) by mouth daily. 90 tablet 1  . metFORMIN (GLUCOPHAGE) 1000 MG tablet TAKE 1 TABLET TWICE DAILY WITH A MEAL 180 tablet 1  . naproxen (NAPROSYN) 250 MG tablet TAKE 1 TABLET TWICE DAILY WITH MEALS 180 tablet 1  . omeprazole (PRILOSEC) 40 MG capsule Take 1 capsule (40 mg total) by mouth 2 (two) times daily. 180 capsule 1  . cyclobenzaprine (FLEXERIL) 10 MG tablet TAKE 1 TABLET TWICE DAILY AS NEEDED FOR MUSCLE SPASM(S) 90 tablet 1  . gabapentin (NEURONTIN) 300 MG capsule Take 1 capsule (300 mg total) by mouth at bedtime. 90 capsule 1   No facility-administered medications prior to visit.     ROS Review of Systems  Constitutional: Negative for activity change, appetite change and fatigue.  HENT: Negative for congestion, sinus pressure and sore throat.   Eyes: Negative for visual disturbance.  Respiratory: Negative for cough,  chest tightness, shortness of breath and wheezing.   Cardiovascular: Negative for chest pain and palpitations.  Gastrointestinal: Negative for abdominal distention, abdominal pain and constipation.  Endocrine: Negative for polydipsia.  Genitourinary: Negative for dysuria and frequency.  Musculoskeletal: Negative for arthralgias and back pain.  Skin: Negative for rash.  Neurological: Positive for numbness. Negative for tremors and light-headedness.  Hematological: Does not bruise/bleed easily.  Psychiatric/Behavioral: Negative for agitation and behavioral problems.    Objective:  BP 108/70   Pulse 69   Temp 98.1 F  (36.7 C) (Oral)   Ht  (1.676 m)   Wt 194 lb 3.2 oz (88.1 kg)   SpO2 100%   BMI 31.34 kg/m   BP/Weight 06/16/2017 03/20/2017 12/16/2016  Systolic BP 108 116 131  Diastolic BP 70 72 76  Wt. (Lbs) 194.2 196.4 192  BMI 31.34 31.7 30.99      Physical Exam  Constitutional: She is oriented to person, place, and time. She appears well-developed and well-nourished.  Cardiovascular: Normal rate, normal heart sounds and intact distal pulses.  No murmur heard. Pulmonary/Chest: Effort normal and breath sounds normal. She has no wheezes. She has no rales. She exhibits no tenderness.  Abdominal: Soft. Bowel sounds are normal. She exhibits no distension and no mass. There is no tenderness.  Musculoskeletal: Normal range of motion.  Neurological: She is alert and oriented to person, place, and time.  Skin: Skin is warm and dry.  Psychiatric: She has a normal mood and affect.      CMP Latest Ref Rng & Units 03/23/2017 12/16/2016 06/18/2016  Glucose 65 - 99 mg/dL 96 69 213(Y)  BUN 6 - 24 mg/dL Creatinine 0.57 - 1.00 mg/dL 8.65 7.84 6.96  Sodium 134 - 144 mmol/L 145(H) 147(H) 143  Potassium 3.5 - 5.2 mmol/L 5.0 4.4 4.2  Chloride 96 - 106 mmol/L 111(H) 112(H) 110  CO2 20 - 29 mmol/L 23 24 -  Calcium 8.7 - 10.2 mg/dL 9.4 8.9 -  Total Protein 6.0 - 8.5 g/dL 6.3 6.4 -  Total Bilirubin 0.0 - 1.2 mg/dL 0.2 <2.9 -  Alkaline Phos 39 - 117 IU/L 76 76 -  AST 0 - 40 IU/L 29 47(H) -  ALT 0 - 32 IU/L 33(H) 48(H) -    Lipid Panel     Component Value Date/Time   CHOL 97 (L) 03/23/2017 1008   TRIG 54 03/23/2017 1008   HDL 49 03/23/2017 1008   CHOLHDL 2.0 03/23/2017 1008   CHOLHDL 1.9 02/22/2016 0841   LDLCALC 37 03/23/2017 1008    Lab Results  Component Value Date   HGBA1C 6.9 06/16/2017    Assessment & Plan:   1. Diabetes mellitus without complication (HCC) Controlled with A1c of 6.9 Continue diabetic diet, lifestyle modifications - POCT glucose (manual entry) - POCT  glycosylated hemoglobin (Hb A1C) - Ambulatory referral to Podiatry  2. Other diabetic neurological complication associated with type 2 diabetes mellitus Uncontrolled Increased dose of Gabapentin - cyclobenzaprine (FLEXERIL) 10 MG tablet; TAKE 1 TABLET TWICE DAILY AS NEEDED FOR MUSCLE SPASM(S)  Dispense: 90 tablet; Refill: 1 - gabapentin (NEURONTIN) 300 MG capsule; Take 1 capsule (300 mg total) by mouth 2 (two) times daily.  Dispense: 180 capsule; Refill: 1 - Ambulatory referral to Podiatry  3. Essential hypertension Controlled Continue antihypertensives  4. Peptic ulcer disease Controlled on PPI  5. Pure hypercholesterolemia Controlled Low cholesterol diet  Meds ordered this encounter  Medications  . cyclobenzaprine (FLEXERIL) 10 MG tablet  Sig: TAKE 1 TABLET TWICE DAILY AS NEEDED FOR MUSCLE SPASM(S)    Dispense:  90 tablet    Refill:  1  . gabapentin (NEURONTIN) 300 MG capsule    Sig: Take 1 capsule (300 mg total) by mouth 2 (two) times daily.    Dispense:  180 capsule    Refill:  1    Discontinue previous dose    Follow-up: Return in about 3 months (around 09/16/2017) for follow up on chronic medical conditions.   Hoy Register MD

## 2017-06-16 NOTE — Patient Instructions (Signed)
Diabetic Neuropathy Diabetic neuropathy is a nerve disease or nerve damage that is caused by diabetes mellitus. About half of all people with diabetes mellitus have some form of nerve damage. Nerve damage is more common in those who have had diabetes mellitus for many years and who generally have not had good control of their blood sugar (glucose) level. Diabetic neuropathy is a common complication of diabetes mellitus. There are three common types of diabetic neuropathy and a fourth type that is less common and less understood:  Peripheral neuropathy-This is the most common type of diabetic neuropathy. It causes damage to the nerves of the feet and legs first and then eventually the hands and arms. The damage affects the ability to sense touch.  Autonomic neuropathy-This type causes damage to the autonomic nervous system, which controls the following functions: ? Heartbeat. ? Body temperature. ? Blood pressure. ? Urination. ? Digestion. ? Sweating. ? Sexual function.  Focal neuropathy-Focal neuropathy can be painful and unpredictable and occurs most often in older adults with diabetes mellitus. It involves a specific nerve or one area and often comes on suddenly. It usually does not cause long-term problems.  Radiculoplexus neuropathy- Sometimes called lumbosacral radiculoplexus neuropathy, radiculoplexus neuropathy affects the nerves of the thighs, hips, buttocks, or legs. It is more common in people with type 2 diabetes mellitus and in older men. It is characterized by debilitating pain, weakness, and atrophy, usually in the thigh muscles.  What are the causes? The cause of peripheral, autonomic, and focal neuropathies is diabetes mellitus that is uncontrolled and high glucose levels. The cause of radiculoplexus neuropathy is unknown. However, it is thought to be caused by inflammation related to uncontrolled glucose levels. What are the signs or symptoms? Peripheral Neuropathy Peripheral  neuropathy develops slowly over time. When the nerves of the feet and legs no longer work there may be:  Burning, stabbing, or aching pain in the legs or feet.  Inability to feel pressure or pain in your feet. This can lead to: ? Thick calluses over pressure areas. ? Pressure sores. ? Ulcers.  Foot deformities.  Reduced ability to feel temperature changes.  Muscle weakness.  Autonomic Neuropathy The symptoms of autonomic neuropathy vary depending on which nerves are affected. Symptoms may include:  Problems with digestion, such as: ? Feeling sick to your stomach (nausea). ? Vomiting. ? Bloating. ? Constipation. ? Diarrhea. ? Abdominal pain.  Difficulty with urination. This occurs if you lose your ability to sense when your bladder is full. Problems include: ? Urine leakage (incontinence). ? Inability to empty your bladder completely (retention).  Rapid or irregular heartbeat (palpitations).  Blood pressure drops when you stand up (orthostatic hypotension). When you stand up you may feel: ? Dizzy. ? Weak. ? Faint.  In men, inability to attain and maintain an erection.  In women, vaginal dryness and problems with decreased sexual desire and arousal.  Problems with body temperature regulation.  Increased or decreased sweating.  Focal Neuropathy  Abnormal eye movements or abnormal alignment of both eyes.  Weakness in the wrist.  Foot drop. This results in an inability to lift the foot properly and abnormal walking or foot movement.  Paralysis on one side of your face (Bell palsy).  Chest or abdominal pain. Radiculoplexus Neuropathy  Sudden, severe pain in your hip, thigh, or buttocks.  Weakness and wasting of thigh muscles.  Difficulty rising from a seated position.  Abdominal swelling.  Unexplained weight loss (usually more than 10 lb [4.5 kg]). How is   this diagnosed? Peripheral Neuropathy Your senses may be tested. Sensory function testing can be  done with:  A light touch using a monofilament.  A vibration with tuning fork.  A sharp sensation with a pin prick.  Other tests that can help diagnose neuropathy are:  Nerve conduction velocity. This test checks the transmission of an electrical current through a nerve.  Electromyography. This shows how muscles respond to electrical signals transmitted by nearby nerves.  Quantitative sensory testing. This is used to assess how your nerves respond to vibrations and changes in temperature.  Autonomic Neuropathy Diagnosis is often based on reported symptoms. Tell your health care provider if you experience:  Dizziness.  Constipation.  Diarrhea.  Inappropriate urination or inability to urinate.  Inability to get or maintain an erection.  Tests that may be done include:  Electrocardiography or Holter monitor. These are tests that can help show problems with the heart rate or heart rhythm.  An X-ray exam may be done.  Focal Neuropathy Diagnosis is made based on your symptoms and what your health care provider finds during your exam. Other tests may be done. They may include:  Nerve conduction velocities. This checks the transmission of electrical current through a nerve.  Electromyography. This shows how muscles respond to electrical signals transmitted by nearby nerves.  Quantitative sensory testing. This test is used to assess how your nerves respond to vibration and changes in temperature.  Radiculoplexus Neuropathy  Often the first thing is to eliminate any other issue or problems that might be the cause, as there is no standard test for diagnosis.  X-ray exam of your spine and lumbar region.  Spinal tap to rule out cancer.  MRI to rule out other lesions. How is this treated? Once nerve damage occurs, it cannot be reversed. The goal of treatment is to keep the disease or nerve damage from getting worse and affecting more nerve fibers. Controlling your blood  glucose level is the key. Most people with radiculoplexus neuropathy see at least a partial improvement over time. You will need to keep your blood glucose and HbA1c levels in the target range determined by your health care provider. Things that help control blood glucose levels include:  Blood glucose monitoring.  Meal planning.  Physical activity.  Diabetes medicine.  Over time, maintaining lower blood glucose levels helps lessen symptoms. Sometimes, prescription pain medicine is needed. Follow these instructions at home:  Do not smoke.  Keep your blood glucose level in the range that you and your health care provider have determined acceptable for you.  Keep your blood pressure level in the range that you and your health care provider have determined acceptable for you.  Eat a well-balanced diet.  Be physically active every day. Include strength training and balance exercises.  Protect your feet. ? Check your feet every day for sores, cuts, blisters, or signs of infection. ? Wear padded socks and supportive shoes. Use orthotic inserts, if necessary. ? Regularly check the insides of your shoes for worn spots. Make sure there are no rocks or other items inside your shoes before you put them on. Contact a health care provider if:  You have burning, stabbing, or aching pain in the legs or feet.  You are unable to feel pressure or pain in your feet.  You develop problems with digestion such as: ? Nausea. ? Vomiting. ? Bloating. ? Constipation. ? Diarrhea. ? Abdominal pain.  You have difficulty with urination, such as: ? Incontinence. ? Retention.    You have palpitations.  You develop orthostatic hypotension. When you stand up you may feel: ? Dizzy. ? Weak. ? Faint.  You cannot attain and maintain an erection (in men).  You have vaginal dryness and problems with decreased sexual desire and arousal (in women).  You have severe pain in your thighs, legs, or  buttocks.  You have unexplained weight loss. This information is not intended to replace advice given to you by your health care provider. Make sure you discuss any questions you have with your health care provider. Document Released: 04/04/2001 Document Revised: 07/02/2015 Document Reviewed: 07/05/2012 Elsevier Interactive Patient Education  2017 [employer]  

## 2017-06-26 ENCOUNTER — Ambulatory Visit
Admission: RE | Admit: 2017-06-26 | Discharge: 2017-06-26 | Disposition: A | Discharge status: Home / Self Care | Payer: Medicare HMO | Source: Ambulatory Visit | Attending: Family Medicine | Admitting: Family Medicine

## 2017-06-26 DIAGNOSIS — Z1231 Encounter for screening mammogram for malignant neoplasm of breast: Secondary | ICD-10-CM

## 2017-06-27 ENCOUNTER — Telehealth: Payer: Self-pay

## 2017-06-27 NOTE — Telephone Encounter (Signed)
-----   Message from Hoy Register, MD sent at 06/27/2017 12:34 PM EDT ----- Mammogram is negative for malignancy

## 2017-06-27 NOTE — Telephone Encounter (Signed)
Patient was called and informed of lab results. 

## 2017-08-03 ENCOUNTER — Other Ambulatory Visit: Payer: Self-pay | Admitting: Family Medicine

## 2017-08-03 DIAGNOSIS — M545 Low back pain, unspecified: Secondary | ICD-10-CM

## 2017-08-03 DIAGNOSIS — G8929 Other chronic pain: Secondary | ICD-10-CM

## 2017-08-25 ENCOUNTER — Ambulatory Visit (INDEPENDENT_AMBULATORY_CARE_PROVIDER_SITE_OTHER): Payer: Medicare HMO | Admitting: Podiatry

## 2017-08-25 ENCOUNTER — Encounter: Payer: Self-pay | Admitting: Podiatry

## 2017-08-25 VITALS — BP 109/60 | HR 96

## 2017-08-25 DIAGNOSIS — E1142 Type 2 diabetes mellitus with diabetic polyneuropathy: Secondary | ICD-10-CM | POA: Diagnosis not present

## 2017-08-25 DIAGNOSIS — L84 Corns and callosities: Secondary | ICD-10-CM

## 2017-08-25 NOTE — Patient Instructions (Signed)

## 2017-08-27 NOTE — Progress Notes (Signed)
Subjective: Chelsea Rodriguez is a 57 yo AAF who presents to clinic today referred by her PCP, Dr. Hoy RegisterEnobong Newlin.   Chelsea Rodriguez has a diagnosis of diabetes, diabetic neuropathy and is here for diabetic foot evaluation.  She works at [employer] and is on her feet for about 4-6 hours/day.  She notes numbness and swelling in feet.  Numbness is intermittent and swelling in feet occurs by the end of the day.  Medical History   2016 H/O mammogram  1996 Peptic ulcer disease  Date Unknown Anxiety  Date Unknown Depression  Date Unknown Diabetes mellitus without complication (HCC)  Date Unknown Hypertension  Date Unknown Hypertension  Date Unknown Lipid disorder   Problem List   Cardiovascular and Mediastinum  Hypertension   Digestive  Peptic ulcer disease   Endocrine  Diabetes mellitus without complication (HCC)   Other  Normocytic anemia   Hyperlipidemia   History of gastric bypass   Obesity (BMI 30-39.9)   History of DVT (deep vein thrombosis)    Surgical History   2014 Gastric bypass  2014 Colonoscopy  2005 Abdominal hysterectomy  2005 Cholecystectomy   Medications    ACCU-CHEK SOFTCLIX LANCETS lancets    atorvastatin (LIPITOR) 20 MG tablet    carbamide peroxide (DEBROX) 6.5 % otic solution    cetirizine (ZYRTEC) 10 MG tablet    cyclobenzaprine (FLEXERIL) 10 MG tablet    furosemide (LASIX) 20 MG tablet    gabapentin (NEURONTIN) 300 MG capsule    glucose blood (ACCU-CHEK AVIVA) test strip    insulin aspart (NOVOLOG FLEXPEN) 100 UNIT/ML FlexPen    insulin detemir (LEVEMIR) 100 UNIT/ML injection    Insulin Pen Needle (B-D UF III MINI PEN NEEDLES) 31G X 5 MM MISC    Lancet Devices (ACCU-CHEK SOFTCLIX) lancets    lisinopril (PRINIVIL,ZESTRIL) 10 MG tablet    metFORMIN (GLUCOPHAGE) 1000 MG tablet    naproxen (NAPROSYN) 250 MG tablet    omeprazole (PRILOSEC) 40 MG capsule    Allergies      No Known Allergies   Tobacco History   Smoking Status  Never  Smoker  Smokeless Tobacco Status  Never Used   Family History   Mother (Deceased) Diabetes    Heart disease    Breast cancer         Father (Deceased) Heart disease    Diabetes    Immunizations/Injections   Influenza,inj,Quad PF,6+ Mos11/10/2016   Pneumococcal Polysaccharide-232/26/2018   Tdap8/03/2016    ROS: Per HPI unless specifically indicated in ROS section   Objective: Vitals:   08/25/17 1041  BP: 109/60  Pulse: 96   Vascular Examination: Capillary refill time immediate x 10 digits Dorsalis pedis and Posterior tibial pulses present b/l No digital hair x 10 digits Skin temperature gradient WNL b/l  Dermatological Examination: Skin with normal turgor, texture and tone b/l Toenails 1-5 b/l discolored, thick, dystrophic with subungual debris and pain with palpation to nailbeds due to thickness of nails with adequate length b/l. Hyperkeratotic lesion noted submetatarsal head 5 left foot.   Musculoskeletal: Muscle strength 5/5 to all LE muscle groups  Neurological: Sensation diminished with 10 gram monofilament. Vibratory sensation diminished  Assessment: 1.  Callus submetatarsal head 5 left foot 2.  NIDDM with Diabetic neuropathy  Plan: 1. Discussed diabetic foot care principles. Literature dispensed to Chelsea Rodriguez on diabetic foot care. 2. Toenails 1-5 b/l were adequate length on today. 3. Hyperkeratotic lesion debrided submetatarsal head 5 left foot with chisel blade. 4. Patient to  continue soft, supportive shoe gear. 5. Patient to report any pedal injuries to medical professional  6. Follow up 3 months.  7. Patient/POA to call should there be a concern in the interim.

## 2017-09-13 ENCOUNTER — Encounter: Payer: Self-pay | Admitting: Family Medicine

## 2017-09-13 ENCOUNTER — Ambulatory Visit: Payer: Medicare HMO | Attending: Family Medicine | Admitting: Family Medicine

## 2017-09-13 VITALS — BP 100/65 | HR 70 | Temp 98.3°F | Ht 66.0 in | Wt 194.6 lb

## 2017-09-13 DIAGNOSIS — Z79899 Other long term (current) drug therapy: Secondary | ICD-10-CM | POA: Diagnosis not present

## 2017-09-13 DIAGNOSIS — E1140 Type 2 diabetes mellitus with diabetic neuropathy, unspecified: Secondary | ICD-10-CM | POA: Insufficient documentation

## 2017-09-13 DIAGNOSIS — Z86718 Personal history of other venous thrombosis and embolism: Secondary | ICD-10-CM | POA: Diagnosis not present

## 2017-09-13 DIAGNOSIS — E1149 Type 2 diabetes mellitus with other diabetic neurological complication: Secondary | ICD-10-CM

## 2017-09-13 DIAGNOSIS — K279 Peptic ulcer, site unspecified, unspecified as acute or chronic, without hemorrhage or perforation: Secondary | ICD-10-CM | POA: Insufficient documentation

## 2017-09-13 DIAGNOSIS — E119 Type 2 diabetes mellitus without complications: Secondary | ICD-10-CM

## 2017-09-13 DIAGNOSIS — E785 Hyperlipidemia, unspecified: Secondary | ICD-10-CM | POA: Insufficient documentation

## 2017-09-13 DIAGNOSIS — Z9049 Acquired absence of other specified parts of digestive tract: Secondary | ICD-10-CM | POA: Diagnosis not present

## 2017-09-13 DIAGNOSIS — I10 Essential (primary) hypertension: Secondary | ICD-10-CM | POA: Diagnosis not present

## 2017-09-13 DIAGNOSIS — Z9884 Bariatric surgery status: Secondary | ICD-10-CM | POA: Insufficient documentation

## 2017-09-13 DIAGNOSIS — Z90710 Acquired absence of both cervix and uterus: Secondary | ICD-10-CM | POA: Insufficient documentation

## 2017-09-13 DIAGNOSIS — E1169 Type 2 diabetes mellitus with other specified complication: Secondary | ICD-10-CM | POA: Diagnosis not present

## 2017-09-13 DIAGNOSIS — E789 Disorder of lipoprotein metabolism, unspecified: Secondary | ICD-10-CM

## 2017-09-13 DIAGNOSIS — Z794 Long term (current) use of insulin: Secondary | ICD-10-CM | POA: Diagnosis not present

## 2017-09-13 LAB — POCT GLYCOSYLATED HEMOGLOBIN (HGB A1C): HbA1c, POC (controlled diabetic range): 7.2 % — AB (ref 0.0–7.0)

## 2017-09-13 LAB — GLUCOSE, POCT (MANUAL RESULT ENTRY): POC Glucose: 200 mg/dl — AB (ref 70–99)

## 2017-09-13 MED ORDER — INSULIN ASPART 100 UNIT/ML FLEXPEN: PEN_INJECTOR | SUBCUTANEOUS | 0 refills | Status: DC

## 2017-09-13 MED ORDER — LISINOPRIL 10 MG PO TABS: 10.0000 mg | ORAL_TABLET | Freq: Every day | ORAL | 1 refills | Status: DC

## 2017-09-13 MED ORDER — ATORVASTATIN CALCIUM 20 MG PO TABS: 20.0000 mg | ORAL_TABLET | Freq: Every day | ORAL | 1 refills | Status: DC

## 2017-09-13 MED ORDER — INSULIN DETEMIR 100 UNIT/ML ~~LOC~~ SOLN: SUBCUTANEOUS | 5 refills | Status: DC

## 2017-09-13 MED ORDER — GABAPENTIN 300 MG PO CAPS: 300.0000 mg | ORAL_CAPSULE | Freq: Two times a day (BID) | ORAL | 1 refills | Status: DC

## 2017-09-13 MED ORDER — FUROSEMIDE 20 MG PO TABS: 20.0000 mg | ORAL_TABLET | Freq: Every morning | ORAL | 1 refills | Status: DC

## 2017-09-13 MED ORDER — OMEPRAZOLE 40 MG PO CPDR: 40.0000 mg | DELAYED_RELEASE_CAPSULE | Freq: Two times a day (BID) | ORAL | 1 refills | Status: DC

## 2017-09-13 MED ORDER — METFORMIN HCL 1000 MG PO TABS: ORAL_TABLET | ORAL | 1 refills | Status: DC

## 2017-09-13 NOTE — Patient Instructions (Signed)

## 2017-09-13 NOTE — Progress Notes (Signed)
Subjective:  Patient ID: Chelsea Rodriguez, female    DOB: [birth year]  Age: 57 y.o. MRN: [id number]  CC: Diabetes   HPI Chelsea Rodriguez is a 57 year old female with a history of type 2 diabetes mellitus (A1c 7.2), hypertension, dyslipidemia, peptic ulcer disease, previous DVT who comes in for follow-up of her diabetes mellitus. She does have diabetic neuropathy and at her last visit, I had increased her dose of gabapentin and she reports improvement in her neuropathy symptoms.  Her A1c is 7.2 which has trended up from 6.9 previously and she has been compliant with her medication and has had no changes in her lifestyle but does not exercise regularly.  She was seen by podiatry 2 weeks ago with debridement of the hyperkeratotic lesion in her foot. She is tolerating her antihypertensive and her statin with no complaints of myalgias or other adverse effect. Her peptic ulcer disease has been controlled on omeprazole. Denies chest pain, shortness of breath, nausea, vomiting, hematochezia.  Past Medical History:  Diagnosis Date  . Anxiety   . Depression   . Diabetes mellitus without complication (HCC)   . H/O mammogram 2016  . Hypertension   . Hypertension   . Lipid disorder   . Peptic ulcer disease 1996    Past Surgical History:  Procedure Laterality Date  . ABDOMINAL HYSTERECTOMY  2005  . CHOLECYSTECTOMY  2005  . COLONOSCOPY  2014  . GASTRIC BYPASS  2014    No Known Allergies   Outpatient Medications Prior to Visit  Medication Sig Dispense Refill  . ACCU-CHEK SOFTCLIX LANCETS lancets Use as instructed 3 times daily before meals. E11.9 100 each 12  . cyclobenzaprine (FLEXERIL) 10 MG tablet TAKE 1 TABLET TWICE DAILY AS NEEDED FOR MUSCLE SPASM(S) 90 tablet 1  . glucose blood (ACCU-CHEK AVIVA) test strip Use 3 times daily before meals. E11.9 100 each 12  . Insulin Pen Needle (B-D UF III MINI PEN NEEDLES) 31G X 5 MM MISC USE  AS  DIRECTED with insulin 450 each 0  . Lancet Devices  (ACCU-CHEK SOFTCLIX) lancets Use 3 times daily before meals 1 each 5  . naproxen (NAPROSYN) 250 MG tablet TAKE 1 TABLET TWICE DAILY WITH MEALS 60 tablet 0  . atorvastatin (LIPITOR) 20 MG tablet Take 1 tablet (20 mg total) by mouth daily. 90 tablet 1  . furosemide (LASIX) 20 MG tablet Take 1 tablet (20 mg total) by mouth every morning. 90 tablet 1  . gabapentin (NEURONTIN) 300 MG capsule Take 1 capsule (300 mg total) by mouth 2 (two) times daily. 180 capsule 1  . insulin aspart (NOVOLOG FLEXPEN) 100 UNIT/ML FlexPen INJECT 0 TO 12 UNITS THREE TIMES DAILY BEFORE MEALS AS PER SLIDING SCALE (DISCARD AND BEGIN A NEW PEN EVERY 28 DAYS) 45 mL 0  . insulin detemir (LEVEMIR) 100 UNIT/ML injection Inject subcutaneously twice daily 25 units in the morning and 20 units in the evening 30 mL 5  . lisinopril (PRINIVIL,ZESTRIL) 10 MG tablet Take 1 tablet (10 mg total) by mouth daily. 90 tablet 1  . metFORMIN (GLUCOPHAGE) 1000 MG tablet TAKE 1 TABLET TWICE DAILY WITH A MEAL 180 tablet 1  . omeprazole (PRILOSEC) 40 MG capsule Take 1 capsule (40 mg total) by mouth 2 (two) times daily. 180 capsule 1  . carbamide peroxide (DEBROX) 6.5 % otic solution Place 5 drops into both ears 2 (two) times daily. (Patient not taking: Reported on 09/13/2017) 15 mL 0  . cetirizine (ZYRTEC) 10 MG tablet Take  1 tablet (10 mg total) by mouth daily. (Patient not taking: Reported on 09/13/2017) 90 tablet 1   No facility-administered medications prior to visit.     ROS Review of Systems  Constitutional: Negative for activity change, appetite change and fatigue.  HENT: Negative for congestion, sinus pressure and sore throat.   Eyes: Negative for visual disturbance.  Respiratory: Negative for cough, chest tightness, shortness of breath and wheezing.   Cardiovascular: Negative for chest pain and palpitations.  Gastrointestinal: Negative for abdominal distention, abdominal pain and constipation.  Endocrine: Negative for polydipsia.    Genitourinary: Negative for dysuria and frequency.  Musculoskeletal: Negative for arthralgias and back pain.  Skin: Negative for rash.  Neurological: Negative for tremors, light-headedness and numbness.  Hematological: Does not bruise/bleed easily.  Psychiatric/Behavioral: Negative for agitation and behavioral problems.    Objective:  BP 100/65   Pulse 70   Temp 98.3 F (36.8 C) (Oral)   Ht 5\' 6"  (1.676 m)   Wt 194 lb 9.6 oz (88.3 kg)   SpO2 100%   BMI 31.41 kg/m   BP/Weight 09/13/2017 08/25/2017 06/16/2017  Systolic BP 100 109 108  Diastolic BP 65 60 70  Wt. (Lbs) 194.6 - 194.2  BMI 31.41 - 31.34      Physical Exam  Constitutional: She is oriented to person, place, and time. She appears well-developed and well-nourished.  Cardiovascular: Normal rate, normal heart sounds and intact distal pulses.  No murmur heard. Pulmonary/Chest: Effort normal and breath sounds normal. She has no wheezes. She has no rales. She exhibits no tenderness.  Abdominal: Soft. Bowel sounds are normal. She exhibits no distension and no mass. There is no tenderness.  Musculoskeletal: Normal range of motion.  Neurological: She is alert and oriented to person, place, and time.  Skin: Skin is warm and dry.  Psychiatric: She has a normal mood and affect.     CMP Latest Ref Rng & Units 03/23/2017 12/16/2016 06/18/2016  Glucose 65 - 99 mg/dL 96 69 161(W)  BUN 6 - 24 mg/dL 13 12 11   Creatinine 0.57 - 1.00 mg/dL 9.60 4.54 0.98  Sodium 134 - 144 mmol/L 145(H) 147(H) 143  Potassium 3.5 - 5.2 mmol/L 5.0 4.4 4.2  Chloride 96 - 106 mmol/L 111(H) 112(H) 110  CO2 20 - 29 mmol/L 23 24 -  Calcium 8.7 - 10.2 mg/dL 9.4 8.9 -  Total Protein 6.0 - 8.5 g/dL 6.3 6.4 -  Total Bilirubin 0.0 - 1.2 mg/dL 0.2 <1.1 -  Alkaline Phos 39 - 117 IU/L 76 76 -  AST 0 - 40 IU/L 29 47(H) -  ALT 0 - 32 IU/L 33(H) 48(H) -    Lipid Panel     Component Value Date/Time   CHOL 97 (L) 03/23/2017 1008   TRIG 54 03/23/2017 1008    HDL 49 03/23/2017 1008   CHOLHDL 2.0 03/23/2017 1008   CHOLHDL 1.9 02/22/2016 0841   LDLCALC 37 03/23/2017 1008    Lab Results  Component Value Date   HGBA1C 7.2 (A) 09/13/2017    Assessment & Plan:   1. Diabetes mellitus without complication (HCC) Controlled with A1c of 7.2 but this has trended up from 6.9 previously Advised to adhere to an exercise regimen Recently seen by the podiatrist - POCT glucose (manual entry) - POCT glycosylated hemoglobin (Hb A1C) - insulin aspart (NOVOLOG FLEXPEN) 100 UNIT/ML FlexPen; INJECT 0 TO 12 UNITS THREE TIMES DAILY BEFORE MEALS AS PER SLIDING SCALE (DISCARD AND BEGIN A NEW PEN EVERY 28 DAYS)  Dispense: 45 mL; Refill: 0  2. Peptic ulcer disease Controlled - omeprazole (PRILOSEC) 40 MG capsule; Take 1 capsule (40 mg total) by mouth 2 (two) times daily.  Dispense: 180 capsule; Refill: 1  3. Type 2 diabetes mellitus with other specified complication, with long-term current use of insulin (HCC) Controlled - metFORMIN (GLUCOPHAGE) 1000 MG tablet; TAKE 1 TABLET TWICE DAILY WITH A MEAL  Dispense: 180 tablet; Refill: 1 - insulin detemir (LEVEMIR) 100 UNIT/ML injection; Inject subcutaneously twice daily 25 units in the morning and 20 units in the evening  Dispense: 30 mL; Refill: 5  4. Essential hypertension Controlled No sodium, DASH diet - lisinopril (PRINIVIL,ZESTRIL) 10 MG tablet; Take 1 tablet (10 mg total) by mouth daily.  Dispense: 90 tablet; Refill: 1 - furosemide (LASIX) 20 MG tablet; Take 1 tablet (20 mg total) by mouth every morning.  Dispense: 90 tablet; Refill: 1  5. Other diabetic neurological complication associated with type 2 diabetes mellitus (HCC) Improved - gabapentin (NEURONTIN) 300 MG capsule; Take 1 capsule (300 mg total) by mouth 2 (two) times daily.  Dispense: 180 capsule; Refill: 1  6. Lipid disorder Controlled Low-cholesterol diet - atorvastatin (LIPITOR) 20 MG tablet; Take 1 tablet (20 mg total) by mouth daily.   Dispense: 90 tablet; Refill: 1  Fasting labs at next visit Meds ordered this encounter  Medications  . omeprazole (PRILOSEC) 40 MG capsule    Sig: Take 1 capsule (40 mg total) by mouth 2 (two) times daily.    Dispense:  180 capsule    Refill:  1  . metFORMIN (GLUCOPHAGE) 1000 MG tablet    Sig: TAKE 1 TABLET TWICE DAILY WITH A MEAL    Dispense:  180 tablet    Refill:  1  . lisinopril (PRINIVIL,ZESTRIL) 10 MG tablet    Sig: Take 1 tablet (10 mg total) by mouth daily.    Dispense:  90 tablet    Refill:  1  . insulin detemir (LEVEMIR) 100 UNIT/ML injection    Sig: Inject subcutaneously twice daily 25 units in the morning and 20 units in the evening    Dispense:  30 mL    Refill:  5    Discontinue previous dose  . insulin aspart (NOVOLOG FLEXPEN) 100 UNIT/ML FlexPen    Sig: INJECT 0 TO 12 UNITS THREE TIMES DAILY BEFORE MEALS AS PER SLIDING SCALE (DISCARD AND BEGIN A NEW PEN EVERY 28 DAYS)    Dispense:  45 mL    Refill:  0  . gabapentin (NEURONTIN) 300 MG capsule    Sig: Take 1 capsule (300 mg total) by mouth 2 (two) times daily.    Dispense:  180 capsule    Refill:  1    Discontinue previous dose  . furosemide (LASIX) 20 MG tablet    Sig: Take 1 tablet (20 mg total) by mouth every morning.    Dispense:  90 tablet    Refill:  1  . atorvastatin (LIPITOR) 20 MG tablet    Sig: Take 1 tablet (20 mg total) by mouth daily.    Dispense:  90 tablet    Refill:  1    Follow-up: Return in about 6 months (around 03/16/2018) for Follow-up of chronic medical conditions.   Hoy Register MD

## 2017-09-25 ENCOUNTER — Ambulatory Visit: Payer: Medicare HMO | Admitting: Family Medicine

## 2017-11-28 ENCOUNTER — Ambulatory Visit: Payer: Medicare HMO | Admitting: Podiatry

## 2017-12-07 ENCOUNTER — Ambulatory Visit (INDEPENDENT_AMBULATORY_CARE_PROVIDER_SITE_OTHER): Payer: Medicare HMO | Admitting: Podiatry

## 2017-12-07 ENCOUNTER — Encounter: Payer: Self-pay | Admitting: Podiatry

## 2017-12-07 ENCOUNTER — Ambulatory Visit: Payer: Medicare HMO | Admitting: Podiatry

## 2017-12-07 DIAGNOSIS — M79675 Pain in left toe(s): Secondary | ICD-10-CM | POA: Diagnosis not present

## 2017-12-07 DIAGNOSIS — Z794 Long term (current) use of insulin: Secondary | ICD-10-CM | POA: Diagnosis not present

## 2017-12-07 DIAGNOSIS — B351 Tinea unguium: Secondary | ICD-10-CM | POA: Diagnosis not present

## 2017-12-07 DIAGNOSIS — L84 Corns and callosities: Secondary | ICD-10-CM

## 2017-12-07 DIAGNOSIS — E1142 Type 2 diabetes mellitus with diabetic polyneuropathy: Secondary | ICD-10-CM

## 2017-12-07 DIAGNOSIS — M79674 Pain in right toe(s): Secondary | ICD-10-CM | POA: Diagnosis not present

## 2017-12-07 NOTE — Patient Instructions (Addendum)
PURCHASE NEW BALANCE SNEAKERS AT:  [email or web address]   Diabetes and Foot Care Diabetes may cause you to have problems because of poor blood supply (circulation) to your feet and legs. This may cause the skin on your feet to become thinner, break easier, and heal more slowly. Your skin may become dry, and the skin may peel and crack. You may also have nerve damage in your legs and feet causing decreased feeling in them. You may not notice minor injuries to your feet that could lead to infections or more serious problems. Taking care of your feet is one of the most important things you can do for yourself. Follow these instructions at home:  Wear shoes at all times, even in the house. Do not go barefoot. Bare feet are easily injured.  Check your feet daily for blisters, cuts, and redness. If you cannot see the bottom of your feet, use a mirror or ask someone for help.  Wash your feet with warm water (do not use hot water) and mild soap. Then pat your feet and the areas between your toes until they are completely dry. Do not soak your feet as this can dry your skin.  Apply a moisturizing lotion or petroleum jelly (that does not contain alcohol and is unscented) to the skin on your feet and to dry, brittle toenails. Do not apply lotion between your toes.  Trim your toenails straight across. Do not dig under them or around the cuticle. File the edges of your nails with an emery board or nail file.  Do not cut corns or calluses or try to remove them with medicine.  Wear clean socks or stockings every day. Make sure they are not too tight. Do not wear knee-high stockings since they may decrease blood flow to your legs.  Wear shoes that fit properly and have enough cushioning. To break in new shoes, wear them for just a few hours a day. This prevents you from injuring your feet. Always look in your shoes before you put them on to be sure there are no objects inside.  Do not cross your  legs. This may decrease the blood flow to your feet.  If you find a minor scrape, cut, or break in the skin on your feet, keep it and the skin around it clean and dry. These areas may be cleansed with mild soap and water. Do not cleanse the area with peroxide, alcohol, or iodine.  When you remove an adhesive bandage, be sure not to damage the skin around it.  If you have a wound, look at it several times a day to make sure it is healing.  Do not use heating pads or hot water bottles. They may burn your skin. If you have lost feeling in your feet or legs, you may not know it is happening until it is too late.  Make sure your health care provider performs a complete foot exam at least annually or more often if you have foot problems. Report any cuts, sores, or bruises to your health care provider immediately. Contact a health care provider if:  You have an injury that is not healing.  You have cuts or breaks in the skin.  You have an ingrown nail.  You notice redness on your legs or feet.  You feel burning or tingling in your legs or feet.  You have pain or cramps in your legs and feet.  Your legs or feet are numb.  Your feet always feel cold.  Get help right away if:  There is increasing redness, swelling, or pain in or around a wound.  There is a red line that goes up your leg.  Pus is coming from a wound.  You develop a fever or as directed by your health care provider.  You notice a bad smell coming from an ulcer or wound. This information is not intended to replace advice given to you by your health care provider. Make sure you discuss any questions you have with your health care provider. Document Released: 01/22/2000 Document Revised: 07/02/2015 Document Reviewed: 07/03/2012 Elsevier Interactive Patient Education  2017 [employer].

## 2017-12-07 NOTE — Progress Notes (Signed)
Subjective:  Ms. Mcgroarty is here today for follow up diabetic foot care. She relates her blood sugar was 146 mg/dl this morning. She has not eaten breakfast.  Her last PCP appt was August 7th with Dr. Odette Horns. She is working 5 hours per day on her feet at [employer].  She voices no new pedal concerns on today's visit. She continues to take Gabapentin for her diabetic neuropathy and voices no increase in symptoms. She states she will have her A1c tested on her next PCP's visit in 3 months.  Objective:  Vascular: Palpable pedal pulses. No digital hair x 10 digits.Skin temperature gradient WNL b/l.  Dermatological Examination: No open wounds b/l. No interdigital maceration b/l.  Toenails 1-5 b/l slightly elongated, discolored x 10.  Right 3rd digit lateral border noted with nail border hypertrophy and incurvated nailplate. No erythema, no edema, no drainage. Hyperkeratotic lesion submetatarsal head 5 left foot.  Neurological Examination: Diminished protective sensation b/l  Musculoskeletal Examination: Muscle strength 5/5 to all muscle groups b/  Assessment: 1. Onychomycosis 1-5 b/l 2. Callus submet head 5 left foot 3. NIDDM with neuropathy  Plan: 1. Toenails 1-5 b/l were debrided in length and girth without complication 2. Callus pared submet head 5 left foot 3.  Continue soft, supportive shoe gear daily. She was instructed to purchase new shoes every 6 months. She related understanding. 4. Follow up 3 months.  5. Call office should there be any questions/concerns in the interim.

## 2018-01-01 ENCOUNTER — Other Ambulatory Visit: Payer: Self-pay | Admitting: Family Medicine

## 2018-01-01 DIAGNOSIS — K279 Peptic ulcer, site unspecified, unspecified as acute or chronic, without hemorrhage or perforation: Secondary | ICD-10-CM

## 2018-01-01 DIAGNOSIS — E789 Disorder of lipoprotein metabolism, unspecified: Secondary | ICD-10-CM

## 2018-01-01 DIAGNOSIS — Z794 Long term (current) use of insulin: Secondary | ICD-10-CM

## 2018-01-01 DIAGNOSIS — E1149 Type 2 diabetes mellitus with other diabetic neurological complication: Secondary | ICD-10-CM

## 2018-01-01 DIAGNOSIS — E1169 Type 2 diabetes mellitus with other specified complication: Secondary | ICD-10-CM

## 2018-01-01 DIAGNOSIS — I10 Essential (primary) hypertension: Secondary | ICD-10-CM

## 2018-01-01 DIAGNOSIS — E119 Type 2 diabetes mellitus without complications: Secondary | ICD-10-CM

## 2018-01-01 NOTE — Telephone Encounter (Signed)
1) Medication(s) Requested (by name): All medication    2) [facility]   3) Special Requests:  Told patient that we would need all the names of medication. Patient stated that she takes everything on her chart. Patient was told that she would probably be denied until we have the names of the medications.     Approved medications will be sent to the pharmacy, we will reach out if there is an issue.  Requests made after 3pm may not be addressed until the following business day!  If a patient is unsure of the name of the medication(s) please note and ask patient to call back when they are able to provide all info, do not send to responsible party until all information is available!

## 2018-01-02 MED ORDER — ACCU-CHEK SOFTCLIX LANCETS MISC: 12 refills | Status: AC

## 2018-01-02 MED ORDER — GLUCOSE BLOOD VI STRP: ORAL_STRIP | 12 refills | Status: AC

## 2018-01-02 MED ORDER — LISINOPRIL 10 MG PO TABS: 10.0000 mg | ORAL_TABLET | Freq: Every day | ORAL | 1 refills | Status: DC

## 2018-01-02 MED ORDER — ATORVASTATIN CALCIUM 20 MG PO TABS: 20.0000 mg | ORAL_TABLET | Freq: Every day | ORAL | 1 refills | Status: DC

## 2018-01-02 MED ORDER — METFORMIN HCL 1000 MG PO TABS: ORAL_TABLET | ORAL | 1 refills | Status: DC

## 2018-01-02 MED ORDER — INSULIN PEN NEEDLE 31G X 5 MM MISC: 1 refills | Status: AC

## 2018-01-02 MED ORDER — INSULIN ASPART 100 UNIT/ML FLEXPEN: PEN_INJECTOR | SUBCUTANEOUS | 0 refills | Status: DC

## 2018-01-02 MED ORDER — GABAPENTIN 300 MG PO CAPS: 300.0000 mg | ORAL_CAPSULE | Freq: Two times a day (BID) | ORAL | 1 refills | Status: DC

## 2018-01-02 MED ORDER — OMEPRAZOLE 40 MG PO CPDR: 40.0000 mg | DELAYED_RELEASE_CAPSULE | Freq: Two times a day (BID) | ORAL | 1 refills | Status: DC

## 2018-01-02 MED ORDER — FUROSEMIDE 20 MG PO TABS: 20.0000 mg | ORAL_TABLET | Freq: Every morning | ORAL | 1 refills | Status: DC

## 2018-01-02 MED ORDER — INSULIN DETEMIR 100 UNIT/ML ~~LOC~~ SOLN: SUBCUTANEOUS | 5 refills | Status: AC

## 2018-01-02 NOTE — Telephone Encounter (Signed)
I confirmed with the patients insurance that she should use [facility] for her prescriptions now. Please authorize the following prescriptions if appropriate.

## 2018-01-17 ENCOUNTER — Ambulatory Visit: Payer: Medicare HMO | Admitting: Family Medicine

## 2018-01-18 ENCOUNTER — Encounter: Payer: Self-pay | Admitting: Family Medicine

## 2018-01-18 ENCOUNTER — Ambulatory Visit: Payer: Medicare Other | Attending: Family Medicine | Admitting: Family Medicine

## 2018-01-18 ENCOUNTER — Telehealth: Payer: Self-pay | Admitting: Family Medicine

## 2018-01-18 ENCOUNTER — Ambulatory Visit: Payer: Medicare Other | Admitting: Pharmacist

## 2018-01-18 ENCOUNTER — Encounter: Payer: Self-pay | Admitting: Pharmacist

## 2018-01-18 VITALS — BP 99/59 | HR 69 | Temp 97.5°F | Ht 66.0 in | Wt 185.2 lb

## 2018-01-18 DIAGNOSIS — Z86718 Personal history of other venous thrombosis and embolism: Secondary | ICD-10-CM

## 2018-01-18 DIAGNOSIS — F329 Major depressive disorder, single episode, unspecified: Secondary | ICD-10-CM | POA: Insufficient documentation

## 2018-01-18 DIAGNOSIS — E119 Type 2 diabetes mellitus without complications: Secondary | ICD-10-CM

## 2018-01-18 DIAGNOSIS — F419 Anxiety disorder, unspecified: Secondary | ICD-10-CM | POA: Insufficient documentation

## 2018-01-18 DIAGNOSIS — M7989 Other specified soft tissue disorders: Secondary | ICD-10-CM

## 2018-01-18 DIAGNOSIS — E785 Hyperlipidemia, unspecified: Secondary | ICD-10-CM | POA: Insufficient documentation

## 2018-01-18 DIAGNOSIS — K279 Peptic ulcer, site unspecified, unspecified as acute or chronic, without hemorrhage or perforation: Secondary | ICD-10-CM | POA: Diagnosis not present

## 2018-01-18 DIAGNOSIS — Z794 Long term (current) use of insulin: Secondary | ICD-10-CM | POA: Insufficient documentation

## 2018-01-18 DIAGNOSIS — Z79899 Other long term (current) drug therapy: Secondary | ICD-10-CM | POA: Insufficient documentation

## 2018-01-18 DIAGNOSIS — I10 Essential (primary) hypertension: Secondary | ICD-10-CM | POA: Diagnosis not present

## 2018-01-18 DIAGNOSIS — Z9884 Bariatric surgery status: Secondary | ICD-10-CM | POA: Insufficient documentation

## 2018-01-18 DIAGNOSIS — Z791 Long term (current) use of non-steroidal anti-inflammatories (NSAID): Secondary | ICD-10-CM | POA: Insufficient documentation

## 2018-01-18 LAB — POCT GLYCOSYLATED HEMOGLOBIN (HGB A1C): Hemoglobin A1C: 7.4 % — AB (ref 4.0–5.6)

## 2018-01-18 LAB — GLUCOSE, POCT (MANUAL RESULT ENTRY): POC Glucose: 145 mg/dl — AB (ref 70–99)

## 2018-01-18 MED ORDER — LIRAGLUTIDE 18 MG/3ML ~~LOC~~ SOPN: 0.6000 mg | PEN_INJECTOR | Freq: Every day | SUBCUTANEOUS | 3 refills | Status: DC

## 2018-01-18 MED ORDER — LISINOPRIL 5 MG PO TABS: 5.0000 mg | ORAL_TABLET | Freq: Every day | ORAL | 1 refills | Status: DC

## 2018-01-18 MED ORDER — PREDNISONE 20 MG PO TABS: 20.0000 mg | ORAL_TABLET | Freq: Two times a day (BID) | ORAL | 0 refills | Status: DC

## 2018-01-18 NOTE — Telephone Encounter (Signed)
Pt called back. Informed pt the [facility] Pt verbalized understanding and was without complaint.

## 2018-01-18 NOTE — Progress Notes (Signed)
Patient was educated on the use of the Victoza pen. Reviewed necessary supplies and operation of the pen. Also reviewed goal blood glucose levels. Patient was able to demonstrate use. All questions and concerns were addressed.  

## 2018-01-18 NOTE — Progress Notes (Signed)
Patient has knot on right hand.

## 2018-01-18 NOTE — Telephone Encounter (Signed)
1) Medication(s) Requested by Janine OresArleen Licht and [facility]:  LISINOPRIL & LIRAGLUTIDE  2) Pharmacy of Choice: [facility] on [address]  3) Special Requests: ONLY PREDNISONE went through. PATIENT THERE NOW WAITING  Approved medications will be sent to the pharmacy, we will reach out if there is an issue.  Requests made after 3pm may not be addressed until the following business day!  If a patient is unsure of the name of the medication(s) please note and ask patient to call back when they are able to provide all info, do not send to responsible party until all information is available!

## 2018-01-18 NOTE — Progress Notes (Signed)
Subjective:  Patient ID: Chelsea Rodriguez, female    DOB: 11-03-1960  Age: 57 y.o. MRN: [id number]  CC: Diabetes   HPI Chelsea Rodriguez is a 57 year old female with a history of type 2 diabetes mellitus (A1c 7.4), hypertension, dyslipidemia, peptic ulcer disease, previous DVT who comes in for follow-up of her diabetes mellitus. She complains of a 4 month history of swelling of her right index finger with no preceeding history of trauma.She has used Naproxen with no relief and pain is described as moderate. She endorses a history of Gout in the past but has not been on medications. She has been using Levemir 25 units in the morning but no evening dose as she has had fasting hypoglycemia in the past. She does not check her postprandial sugars due to her work schedule. Denies visual concerns, neuropathy. Her reflux symptoms are uncontrolled on her current dose of Omeprazole; she endorses eating late and eating a lot of Pizza. She tolerates her Statin and her antihypertensives with no adverse effects.  Past Medical History:  Diagnosis Date  . Anxiety   . Depression   . Diabetes mellitus without complication ([place])   . H/O mammogram 2016  . Hypertension   . Hypertension   . Lipid disorder   . Peptic ulcer disease 1996    Past Surgical History:  Procedure Laterality Date  . ABDOMINAL HYSTERECTOMY  2005  . CHOLECYSTECTOMY  2005  . COLONOSCOPY  2014  . GASTRIC BYPASS  2014    No Known Allergies   Outpatient Medications Prior to Visit  Medication Sig Dispense Refill  . ACCU-CHEK SOFTCLIX LANCETS lancets Use as instructed 3 times daily before meals. E11.9 100 each 12  . atorvastatin (LIPITOR) 20 MG tablet Take 1 tablet (20 mg total) by mouth daily. 90 tablet 1  . cyclobenzaprine (FLEXERIL) 10 MG tablet TAKE 1 TABLET TWICE DAILY AS NEEDED FOR MUSCLE SPASM(S) 90 tablet 1  . furosemide (LASIX) 20 MG tablet Take 1 tablet (20 mg total) by mouth every morning. 90 tablet 1  . gabapentin  (NEURONTIN) 300 MG capsule Take 1 capsule (300 mg total) by mouth 2 (two) times daily. 180 capsule 1  . glucose blood (ACCU-CHEK AVIVA) test strip Use 3 times daily before meals. E11.9 100 each 12  . insulin detemir (LEVEMIR) 100 UNIT/ML injection Inject subcutaneously twice daily 25 units in the morning and 20 units in the evening 30 mL 5  . Insulin Pen Needle (B-D UF III MINI PEN NEEDLES) 31G X 5 MM MISC USE  AS  DIRECTED up to five times daily with insulin 500 each 1  . metFORMIN (GLUCOPHAGE) 1000 MG tablet TAKE 1 TABLET TWICE DAILY WITH A MEAL 180 tablet 1  . naproxen (NAPROSYN) 250 MG tablet TAKE 1 TABLET TWICE DAILY WITH MEALS 60 tablet 0  . omeprazole (PRILOSEC) 40 MG capsule Take 1 capsule (40 mg total) by mouth 2 (two) times daily. 180 capsule 1  . insulin aspart (NOVOLOG FLEXPEN) 100 UNIT/ML FlexPen INJECT 0 TO 12 UNITS THREE TIMES DAILY BEFORE MEALS AS PER SLIDING SCALE (DISCARD AND BEGIN A NEW PEN EVERY 28 DAYS) 45 mL 0  . lisinopril (PRINIVIL,ZESTRIL) 10 MG tablet Take 1 tablet (10 mg total) by mouth daily. 90 tablet 1  . carbamide peroxide (DEBROX) 6.5 % otic solution Place 5 drops into both ears 2 (two) times daily. (Patient not taking: Reported on 09/13/2017) 15 mL 0  . cetirizine (ZYRTEC) 10 MG tablet Take 1 tablet (10 mg  total) by mouth daily. (Patient not taking: Reported on 09/13/2017) 90 tablet 1   No facility-administered medications prior to visit.     ROS Review of Systems  Constitutional: Negative for activity change, appetite change and fatigue.  HENT: Negative for congestion, sinus pressure and sore throat.   Eyes: Negative for visual disturbance.  Respiratory: Negative for cough, chest tightness, shortness of breath and wheezing.   Cardiovascular: Negative for chest pain and palpitations.  Gastrointestinal: Negative for abdominal distention, abdominal pain and constipation.  Endocrine: Negative for polydipsia.  Genitourinary: Negative for dysuria and frequency.    Musculoskeletal:       See HPI  Skin: Negative for rash.  Neurological: Negative for tremors, light-headedness and numbness.  Hematological: Does not bruise/bleed easily.  Psychiatric/Behavioral: Negative for agitation and behavioral problems.    Objective:  BP (!) 99/59   Pulse 69   Temp (!) 97.5 F (36.4 C) (Oral)   Ht '5\' 6"'$  (1.676 m)   Wt 185 lb 3.2 oz (84 kg)   SpO2 99%   BMI 29.89 kg/m   BP/Weight 01/18/2018 09/13/2017 05/16/8117  Systolic BP 99 147 829  Diastolic BP 59 65 60  Wt. (Lbs) 185.2 194.6 -  BMI 29.89 31.41 -      Physical Exam Constitutional:      Appearance: She is well-developed.  Cardiovascular:     Rate and Rhythm: Normal rate.     Heart sounds: Normal heart sounds. No murmur.  Pulmonary:     Effort: Pulmonary effort is normal.     Breath sounds: Normal breath sounds. No wheezing or rales.  Chest:     Chest wall: No tenderness.  Abdominal:     General: Bowel sounds are normal. There is no distension.     Palpations: Abdomen is soft. There is no mass.     Tenderness: There is no abdominal tenderness.  Musculoskeletal:     Comments: Edema and tenderness of the right second MCP joint, no erythema Inability to make a complete fist bilaterally  Neurological:     Mental Status: She is alert and oriented to person, place, and time.  Psychiatric:        Mood and Affect: Mood normal.        Behavior: Behavior normal.     CMP Latest Ref Rng & Units 03/23/2017 12/16/2016 06/18/2016  Glucose 65 - 99 mg/dL 96 69 100(H)  BUN 6 - 24 mg/dL '13 12 11  '$ Creatinine 0.57 - 1.00 mg/dL 0.91 0.97 1.00  Sodium 134 - 144 mmol/L 145(H) 147(H) 143  Potassium 3.5 - 5.2 mmol/L 5.0 4.4 4.2  Chloride 96 - 106 mmol/L 111(H) 112(H) 110  CO2 20 - 29 mmol/L 23 24 -  Calcium 8.7 - 10.2 mg/dL 9.4 8.9 -  Total Protein 6.0 - 8.5 g/dL 6.3 6.4 -  Total Bilirubin 0.0 - 1.2 mg/dL 0.2 <0.2 -  Alkaline Phos 39 - 117 IU/L 76 76 -  AST 0 - 40 IU/L 29 47(H) -  ALT 0 - 32 IU/L 33(H)  48(H) -    Lipid Panel     Component Value Date/Time   CHOL 97 (L) 03/23/2017 1008   TRIG 54 03/23/2017 1008   HDL 49 03/23/2017 1008   CHOLHDL 2.0 03/23/2017 1008   CHOLHDL 1.9 02/22/2016 0841   LDLCALC 37 03/23/2017 1008    Lab Results  Component Value Date   HGBA1C 7.4 (A) 01/18/2018     Assessment & Plan:   1. Diabetes mellitus  without complication (HCC) Zero 7.4 which is above goal of less than 7 Victoza added to regimen Discontinued short acting insulin She currently uses Levemir 25 units in the morning but not the evening dose due to previous fasting hypoglycemias Diabetic diet, lifestyle modifications - POCT glucose (manual entry) - POCT glycosylated hemoglobin (Hb A1C) - liraglutide (VICTOZA) 18 MG/3ML SOPN; Inject 0.1 mLs (0.6 mg total) into the skin daily with breakfast.  Dispense: 3 pen; Refill: 3  2. Essential hypertension Blood pressure is on the low side Reduce dose of lisinopril - lisinopril (PRINIVIL,ZESTRIL) 5 MG tablet; Take 1 tablet (5 mg total) by mouth daily.  Dispense: 90 tablet; Refill: 1 - CMP14+EGFR  3. Swelling of right hand Will need to exclude gout - Uric Acid - predniSONE (DELTASONE) 20 MG tablet; Take 1 tablet (20 mg total) by mouth 2 (two) times daily with a meal.  Dispense: 10 tablet; Refill: 0  4. Peptic ulcer disease Uncontrolled Advised to avoid late meal, avoid recumbency up to 2 hours post meals    Meds ordered this encounter  Medications  . predniSONE (DELTASONE) 20 MG tablet    Sig: Take 1 tablet (20 mg total) by mouth 2 (two) times daily with a meal.    Dispense:  10 tablet    Refill:  0  . lisinopril (PRINIVIL,ZESTRIL) 5 MG tablet    Sig: Take 1 tablet (5 mg total) by mouth daily.    Dispense:  90 tablet    Refill:  1  . liraglutide (VICTOZA) 18 MG/3ML SOPN    Sig: Inject 0.1 mLs (0.6 mg total) into the skin daily with breakfast.    Dispense:  3 pen    Refill:  3    Discontinue NovoLog    Follow-up: Return in  about 3 months (around 04/19/2018) for follow up of chronic medical conditions.   Charlott Rakes MD

## 2018-01-19 LAB — CMP14+EGFR
A/G RATIO: 1.4 (ref 1.2–2.2)
ALBUMIN: 3.8 g/dL (ref 3.5–5.5)
ALT: 19 IU/L (ref 0–32)
AST: 16 IU/L (ref 0–40)
Alkaline Phosphatase: 68 IU/L (ref 39–117)
BILIRUBIN TOTAL: 0.2 mg/dL (ref 0.0–1.2)
BUN / CREAT RATIO: 15 (ref 9–23)
BUN: 17 mg/dL (ref 6–24)
CHLORIDE: 109 mmol/L — AB (ref 96–106)
CO2: 19 mmol/L — ABNORMAL LOW (ref 20–29)
Calcium: 9 mg/dL (ref 8.7–10.2)
Creatinine, Ser: 1.13 mg/dL — ABNORMAL HIGH (ref 0.57–1.00)
GFR calc Af Amer: 62 mL/min/{1.73_m2} (ref >59)
GFR, EST NON AFRICAN AMERICAN: 54 mL/min/{1.73_m2} — AB (ref >59)
GLOBULIN, TOTAL: 2.7 g/dL (ref 1.5–4.5)
Glucose: 115 mg/dL — ABNORMAL HIGH (ref 65–99)
POTASSIUM: 4.1 mmol/L (ref 3.5–5.2)
Sodium: 145 mmol/L — ABNORMAL HIGH (ref 134–144)
TOTAL PROTEIN: 6.5 g/dL (ref 6.0–8.5)

## 2018-01-19 LAB — URIC ACID: Uric Acid: 7.7 mg/dL — ABNORMAL HIGH (ref 2.5–7.1)

## 2018-01-24 ENCOUNTER — Other Ambulatory Visit: Payer: Self-pay | Admitting: Family Medicine

## 2018-01-24 ENCOUNTER — Telehealth: Payer: Self-pay | Admitting: Family Medicine

## 2018-01-24 DIAGNOSIS — M10041 Idiopathic gout, right hand: Secondary | ICD-10-CM

## 2018-01-24 DIAGNOSIS — M109 Gout, unspecified: Secondary | ICD-10-CM | POA: Insufficient documentation

## 2018-01-24 MED ORDER — ALLOPURINOL 300 MG PO TABS: 300.0000 mg | ORAL_TABLET | Freq: Every day | ORAL | 1 refills | Status: DC

## 2018-01-24 NOTE — Telephone Encounter (Signed)
liraglutide (VICTOZA) 18 MG/3ML SOPN [[id number] Call the pharmacy at [phone] ref [id number]

## 2018-01-25 NOTE — Telephone Encounter (Signed)
Will route to Chelsea Rodriguez. For review.

## 2018-01-29 ENCOUNTER — Telehealth: Payer: Self-pay

## 2018-01-29 NOTE — Telephone Encounter (Signed)
-----   Message from Hoy RegisterEnobong Newlin, MD sent at 01/24/2018  6:12 PM EST ----- Labs revealed the presence of gout which could explain her hand pain.  I have sent a prescription for allopurinol to her pharmacy which will be useful gout prophylaxis and she will need to cut back on red meat, seafood which can trigger gout.  Other labs are stable

## 2018-01-29 NOTE — Telephone Encounter (Signed)
Patient was called and informed of lab results and medication being sent to pharmacy. 

## 2018-02-01 ENCOUNTER — Telehealth: Payer: Self-pay | Admitting: Family Medicine

## 2018-02-01 DIAGNOSIS — M7989 Other specified soft tissue disorders: Secondary | ICD-10-CM

## 2018-02-01 DIAGNOSIS — E119 Type 2 diabetes mellitus without complications: Secondary | ICD-10-CM

## 2018-02-01 MED ORDER — LIRAGLUTIDE 18 MG/3ML ~~LOC~~ SOPN: 0.6000 mg | PEN_INJECTOR | Freq: Every day | SUBCUTANEOUS | 0 refills | Status: DC

## 2018-02-01 NOTE — Telephone Encounter (Signed)
1) Medication(s) Requested (by name): Allopurinol  Patient says that this medication is not working and she would like to get something stronger.  2) Pharmacy of Choice:  [facility] on [address]

## 2018-02-01 NOTE — Telephone Encounter (Signed)
1) Medication(s) Requested (by name): victoza Patient states that she did not receive her victoza when she got her last medication delivery. Please follow up 2) Pharmacy of Choice: [facility]

## 2018-02-01 NOTE — Telephone Encounter (Signed)
Pt reports taking the allopurinol daily without any missed doses and states that she still has pain and swelling in her hands. She said that the 'other medicine' she got where she only took it for a few days (I believe she is thinking of prednisone) worked well but the allopurinol is not enough. I told her there were other options that could replace it or be given in addition depending on the doctors recommendation. Please advise how to move forward, if [facility] will be [facility] [address]  Ms. Aram CandelaKangbeni also wants to make sure it is ok for her to take tylenol or aspirin prn for occassional headache/body aches. Please let me know if we should advise her to continue or use something else.

## 2018-02-02 MED ORDER — PREDNISONE 20 MG PO TABS: 20.0000 mg | ORAL_TABLET | Freq: Two times a day (BID) | ORAL | 0 refills | Status: DC

## 2018-02-02 MED ORDER — COLCHICINE 0.6 MG PO TABS: 0.6000 mg | ORAL_TABLET | Freq: Every day | ORAL | 3 refills | Status: DC

## 2018-02-02 NOTE — Telephone Encounter (Signed)
Refill for prednisone sent to pharmacy along with colchicine which she will use for flares only. Allopurinol should be used for prevention and not for flares. Ok to use Tylenol for body aches.

## 2018-02-02 NOTE — Telephone Encounter (Signed)
Attempted to call pt, vm not set up and could not leave message. I will try to call again later.

## 2018-02-02 NOTE — Telephone Encounter (Signed)
Called and spoke w/ patient notified her of Dr. Baxter FlatteryNewlin's message, she understood and will be picking up the meds from her pharmacy later today.

## 2018-02-14 ENCOUNTER — Telehealth: Payer: Self-pay | Admitting: Family Medicine

## 2018-02-14 NOTE — Telephone Encounter (Signed)
Pt states [facility] will not refill colchicine script until 02/25/18. Please have the doctor authorize refill.  Pt [phone].

## 2018-02-14 NOTE — Telephone Encounter (Signed)
Called patient but was unable to leave message.  Could you please follow-up with her?  Thank you

## 2018-02-14 NOTE — Telephone Encounter (Signed)
RX for colchicine was sent to [facility] on 02/02/18, I'm afraid if the patient is already out that she may be taking the medication too often or that her treatment may need to be re-evaluated at an appointment. Can we follow up with her today please?

## 2018-02-19 MED ORDER — COLCHICINE 0.6 MG PO TABS: 0.6000 mg | ORAL_TABLET | Freq: Every day | ORAL | 2 refills | Status: AC

## 2018-02-19 NOTE — Telephone Encounter (Signed)
I was able to get a hold of Chelsea Rodriguez and she reports taking 1-3 tablets daily prn for her gout. She reports that the colchicine is working well to reduce her symptoms. I asked if she was still experiencing swelling of the extremities, she reports she is but attributes it to fluid retention, she noted that her dose of furosemide was lowered. She states the extremities are swollen but do not have the pain associated with gout. She was advised to continue taking colchicine as directed but if she is continuing to need multiple doses in the same day that she should call and make an appt to be seen before her appt that is currently scheduled for March.  I spoke with [facility] who is working to get her prescription filled w/ insurance. I re-sent the RX with the note included in the SIG so they could note the proper days supply.

## 2018-03-08 ENCOUNTER — Ambulatory Visit: Payer: Medicare HMO | Admitting: Podiatry

## 2018-03-14 ENCOUNTER — Ambulatory Visit: Payer: Medicare Other | Admitting: Podiatry

## 2018-04-11 ENCOUNTER — Telehealth: Payer: Self-pay | Admitting: *Deleted

## 2018-04-11 ENCOUNTER — Ambulatory Visit: Payer: Medicaid Other | Admitting: Family Medicine

## 2018-04-11 NOTE — Telephone Encounter (Signed)
Patient verified DOB Patient was unaware of the appointment being changed from 3/18 to 3/4. Patient was rescheduled for 04/16/2018 at 9:10.

## 2018-04-12 ENCOUNTER — Ambulatory Visit (INDEPENDENT_AMBULATORY_CARE_PROVIDER_SITE_OTHER): Payer: Medicare Other | Admitting: Podiatry

## 2018-04-12 DIAGNOSIS — L84 Corns and callosities: Secondary | ICD-10-CM

## 2018-04-12 DIAGNOSIS — B351 Tinea unguium: Secondary | ICD-10-CM

## 2018-04-12 DIAGNOSIS — M79675 Pain in left toe(s): Secondary | ICD-10-CM | POA: Diagnosis not present

## 2018-04-12 DIAGNOSIS — E1142 Type 2 diabetes mellitus with diabetic polyneuropathy: Secondary | ICD-10-CM

## 2018-04-12 DIAGNOSIS — M79674 Pain in right toe(s): Secondary | ICD-10-CM | POA: Diagnosis not present

## 2018-04-12 NOTE — Patient Instructions (Signed)
Diabetes Mellitus and Foot Care Foot care is an important part of your health, especially when you have diabetes. Diabetes may cause you to have problems because of poor blood flow (circulation) to your feet and legs, which can cause your skin to:  Become thinner and drier.  Break more easily.  Heal more slowly.  Peel and crack. You may also have nerve damage (neuropathy) in your legs and feet, causing decreased feeling in them. This means that you may not notice minor injuries to your feet that could lead to more serious problems. Noticing and addressing any potential problems early is the best way to prevent future foot problems. How to care for your feet Foot hygiene  Wash your feet daily with warm water and mild soap. Do not use hot water. Then, pat your feet and the areas between your toes until they are completely dry. Do not soak your feet as this can dry your skin.  Trim your toenails straight across. Do not dig under them or around the cuticle. File the edges of your nails with an emery board or nail file.  Apply a moisturizing lotion or petroleum jelly to the skin on your feet and to dry, brittle toenails. Use lotion that does not contain alcohol and is unscented. Do not apply lotion between your toes. Shoes and socks  Wear clean socks or stockings every day. Make sure they are not too tight. Do not wear knee-high stockings since they may decrease blood flow to your legs.  Wear shoes that fit properly and have enough cushioning. Always look in your shoes before you put them on to be sure there are no objects inside.  To break in new shoes, wear them for just a few hours a day. This prevents injuries on your feet. Wounds, scrapes, corns, and calluses  Check your feet daily for blisters, cuts, bruises, sores, and redness. If you cannot see the bottom of your feet, use a mirror or ask someone for help.  Do not cut corns or calluses or try to remove them with medicine.  If you  find a minor scrape, cut, or break in the skin on your feet, keep it and the skin around it clean and dry. You may clean these areas with mild soap and water. Do not clean the area with peroxide, alcohol, or iodine.  If you have a wound, scrape, corn, or callus on your foot, look at it several times a day to make sure it is healing and not infected. Check for: ? Redness, swelling, or pain. ? Fluid or blood. ? Warmth. ? Pus or a bad smell. General instructions  Do not cross your legs. This may decrease blood flow to your feet.  Do not use heating pads or hot water bottles on your feet. They may burn your skin. If you have lost feeling in your feet or legs, you may not know this is happening until it is too late.  Protect your feet from hot and cold by wearing shoes, such as at the beach or on hot pavement.  Schedule a complete foot exam at least once a year (annually) or more often if you have foot problems. If you have foot problems, report any cuts, sores, or bruises to your health care provider immediately. Contact a health care provider if:  You have a medical condition that increases your risk of infection and you have any cuts, sores, or bruises on your feet.  You have an injury that is not   healing.  You have redness on your legs or feet.  You feel burning or tingling in your legs or feet.  You have pain or cramps in your legs and feet.  Your legs or feet are numb.  Your feet always feel cold.  You have pain around a toenail. Get help right away if:  You have a wound, scrape, corn, or callus on your foot and: ? You have pain, swelling, or redness that gets worse. ? You have fluid or blood coming from the wound, scrape, corn, or callus. ? Your wound, scrape, corn, or callus feels warm to the touch. ? You have pus or a bad smell coming from the wound, scrape, corn, or callus. ? You have a fever. ? You have a red line going up your leg. Summary  Check your feet every day  for cuts, sores, red spots, swelling, and blisters.  Moisturize feet and legs daily.  Wear shoes that fit properly and have enough cushioning.  If you have foot problems, report any cuts, sores, or bruises to your health care provider immediately.  Schedule a complete foot exam at least once a year (annually) or more often if you have foot problems. This information is not intended to replace advice given to you by your health care provider. Make sure you discuss any questions you have with your health care provider. Document Released: 01/22/2000 Document Revised: 03/08/2017 Document Reviewed: 02/26/2016 Elsevier Interactive Patient Education  2019 [employer]  Onychomycosis/Fungal Toenails  WHAT IS IT? An infection that lies within the keratin of your nail plate that is caused by a fungus.  WHY ME? Fungal infections affect all ages, sexes, races, and creeds.  There may be many factors that predispose you to a fungal infection such as age, coexisting medical conditions such as diabetes, or an autoimmune disease; stress, medications, fatigue, genetics, etc.  Bottom line: fungus thrives in a warm, moist environment and your shoes offer such a location.  IS IT CONTAGIOUS? Theoretically, yes.  You do not want to share shoes, nail clippers or files with someone who has fungal toenails.  Walking around barefoot in the same room or sleeping in the same bed is unlikely to transfer the organism.  It is important to realize, however, that fungus can spread easily from one nail to the next on the same foot.  HOW DO WE TREAT THIS?  There are several ways to treat this condition.  Treatment may depend on many factors such as age, medications, pregnancy, liver and kidney conditions, etc.  It is best to ask your doctor which options are available to you.  1. No treatment.   Unlike many other medical concerns, you can live with this condition.  However for many people this can be a painful condition and  may lead to ingrown toenails or a bacterial infection.  It is recommended that you keep the nails cut short to help reduce the amount of fungal nail. 2. Topical treatment.  These range from herbal remedies to prescription strength nail lacquers.  About 40-50% effective, topicals require twice daily application for approximately 9 to 12 months or until an entirely new nail has grown out.  The most effective topicals are medical grade medications available through physicians offices. 3. Oral antifungal medications.  With an 80-90% cure rate, the most common oral medication requires 3 to 4 months of therapy and stays in your system for a year as the new nail grows out.  Oral antifungal medications do require   blood work to make sure it is a safe drug for you.  A liver function panel will be performed prior to starting the medication and after the first month of treatment.  It is important to have the blood work performed to avoid any harmful side effects.  In general, this medication safe but blood work is required. 4. Laser Therapy.  This treatment is performed by applying a specialized laser to the affected nail plate.  This therapy is noninvasive, fast, and non-painful.  It is not covered by insurance and is therefore, out of pocket.  The results have been very good with a 80-95% cure rate.  [facility] is the only practice in the area to offer this therapy. 5. Permanent Nail Avulsion.  Removing the entire nail so that a new nail will not grow back. 

## 2018-04-16 ENCOUNTER — Ambulatory Visit: Payer: Medicare Other | Attending: Family Medicine | Admitting: Family Medicine

## 2018-04-16 ENCOUNTER — Encounter: Payer: Self-pay | Admitting: Family Medicine

## 2018-04-16 VITALS — BP 99/62 | HR 69 | Temp 98.0°F | Ht 66.0 in | Wt 179.0 lb

## 2018-04-16 DIAGNOSIS — I10 Essential (primary) hypertension: Secondary | ICD-10-CM

## 2018-04-16 DIAGNOSIS — K279 Peptic ulcer, site unspecified, unspecified as acute or chronic, without hemorrhage or perforation: Secondary | ICD-10-CM

## 2018-04-16 DIAGNOSIS — E119 Type 2 diabetes mellitus without complications: Secondary | ICD-10-CM

## 2018-04-16 DIAGNOSIS — M10041 Idiopathic gout, right hand: Secondary | ICD-10-CM

## 2018-04-16 DIAGNOSIS — R600 Localized edema: Secondary | ICD-10-CM

## 2018-04-16 LAB — GLUCOSE, POCT (MANUAL RESULT ENTRY): POC GLUCOSE: 130 mg/dL — AB (ref 70–99)

## 2018-04-16 LAB — POCT GLYCOSYLATED HEMOGLOBIN (HGB A1C): HbA1c, POC (controlled diabetic range): 6.4 % (ref 0.0–7.0)

## 2018-04-16 MED ORDER — LIRAGLUTIDE 18 MG/3ML ~~LOC~~ SOPN: 0.6000 mg | PEN_INJECTOR | Freq: Every day | SUBCUTANEOUS | 6 refills | Status: AC

## 2018-04-16 MED ORDER — PREDNISONE 20 MG PO TABS: 20.0000 mg | ORAL_TABLET | Freq: Two times a day (BID) | ORAL | 0 refills | Status: AC

## 2018-04-16 NOTE — Patient Instructions (Addendum)
Gout  Gout is painful swelling of your joints. Gout is a type of arthritis. It is caused by having too much uric acid in your body. Uric acid is a chemical that is made when your body breaks down substances called purines. If your body has too much uric acid, sharp crystals can form and build up in your joints. This causes pain and swelling. Gout attacks can happen quickly and be very painful (acute gout). Over time, the attacks can affect more joints and happen more often (chronic gout). What are the causes?  Too much uric acid in your blood. This can happen because: ? Your kidneys do not remove enough uric acid from your blood. ? Your body makes too much uric acid. ? You eat too many foods that are high in purines. These foods include organ meats, some seafood, and beer.  Trauma or stress. What increases the risk?  Having a family history of gout.  Being female and middle-aged.  Being female and having gone through menopause.  Being very overweight (obese).  Drinking alcohol, especially beer.  Not having enough water in the body (being dehydrated).  Losing weight too quickly.  Having an organ transplant.  Having lead poisoning.  Taking certain medicines.  Having kidney disease.  Having a skin condition called psoriasis. What are the signs or symptoms? An attack of acute gout usually happens in just one joint. The most common place is the big toe. Attacks often start at night. Other joints that may be affected include joints of the feet, ankle, knee, fingers, wrist, or elbow. Symptoms of an attack may include:  Very bad pain.  Warmth.  Swelling.  Stiffness.  Shiny, red, or purple skin.  Tenderness. The affected joint may be very painful to touch.  Chills and fever. Chronic gout may cause symptoms more often. More joints may be involved. You may also have white or yellow lumps (tophi) on your hands or feet or in other areas near your joints. How is this  treated?  Treatment for this condition has two phases: treating an acute attack and preventing future attacks.  Acute gout treatment may include: ? NSAIDs. ? Steroids. These are taken by mouth or injected into a joint. ? Colchicine. This medicine relieves pain and swelling. It can be given by mouth or through an IV tube.  Preventive treatment may include: ? Taking small doses of NSAIDs or colchicine daily. ? Using a medicine that reduces uric acid levels in your blood. ? Making changes to your diet. You may need to see a food expert (dietitian) about what to eat and drink to prevent gout. Follow these instructions at home: During a gout attack   If told, put ice on the painful area: ? Put ice in a plastic bag. ? Place a towel between your skin and the bag. ? Leave the ice on for 20 minutes, 2-3 times a day.  Raise (elevate) the painful joint above the level of your heart as often as you can.  Rest the joint as much as possible. If the joint is in your leg, you may be given crutches.  Follow instructions from your doctor about what you cannot eat or drink. Avoiding future gout attacks  Eat a low-purine diet. Avoid foods and drinks such as: ? Liver. ? Kidney. ? Anchovies. ? Asparagus. ? Herring. ? Mushrooms. ? Mussels. ? Beer.  Stay at a healthy weight. If you want to lose weight, talk with your doctor. Do not lose weight   too fast.  Start or continue an exercise plan as told by your doctor. Eating and drinking  Drink enough fluids to keep your pee (urine) pale yellow.  If you drink alcohol: ? Limit how much you use to:  0-1 drink a day for women.  0-2 drinks a day for men. ? Be aware of how much alcohol is in your drink. In the [place] one drink equals one 12 oz bottle of beer (355 mL), one 5 oz glass of wine (148 mL), or one 1 oz glass of hard liquor (44 mL). General instructions  Take over-the-counter and prescription medicines only as told by your doctor.  Do  not drive or use heavy machinery while taking prescription pain medicine.  Return to your normal activities as told by your doctor. Ask your doctor what activities are safe for you.  Keep all follow-up visits as told by your doctor. This is important. Contact a doctor if:  You have another gout attack.  You still have symptoms of a gout attack after 10 days of treatment.  You have problems (side effects) because of your medicines.  You have chills or a fever.  You have burning pain when you pee (urinate).  You have pain in your lower back or belly. Get help right away if:  You have very bad pain.  Your pain cannot be controlled.  You cannot pee. Summary  Gout is painful swelling of the joints.  The most common site of pain is the big toe, but it can affect other joints.  Medicines and avoiding some foods can help to prevent and treat gout attacks. This information is not intended to replace advice given to you by your health care provider. Make sure you discuss any questions you have with your health care provider. Document Released: 11/03/2007 Document Revised: 08/16/2017 Document Reviewed: 08/16/2017 Elsevier Interactive Patient Education  2019 [employer] Edema  Edema is when you have too much fluid in your body or under your skin. Edema may make your legs, feet, and ankles swell up. Swelling is also common in looser tissues, like around your eyes. This is a common condition. It gets more common as you get older. There are many possible causes of edema. Eating too much salt (sodium) and being on your feet or sitting for a long time can cause edema in your legs, feet, and ankles. Hot weather may make edema worse. Edema is usually painless. Your skin may look swollen or shiny. Follow these instructions at home:  Keep the swollen body part raised (elevated) above the level of your heart when you are sitting or lying down.  Do not sit still or stand for a long time.  Do  not wear tight clothes. Do not wear garters on your upper legs.  Exercise your legs. This can help the swelling go down.  Wear elastic bandages or support stockings as told by your doctor.  Eat a low-salt (low-sodium) diet to reduce fluid as told by your doctor.  Depending on the cause of your swelling, you may need to limit how much fluid you drink (fluid restriction).  Take over-the-counter and prescription medicines only as told by your doctor. Contact a doctor if:  Treatment is not working.  You have heart, liver, or kidney disease and have symptoms of edema.  You have sudden and unexplained weight gain. Get help right away if:  You have shortness of breath or chest pain.  You cannot breathe when you lie down.  You  have pain, redness, or warmth in the swollen areas.  You have heart, liver, or kidney disease and get edema all of a sudden.  You have a fever and your symptoms get worse all of a sudden. Summary  Edema is when you have too much fluid in your body or under your skin.  Edema may make your legs, feet, and ankles swell up. Swelling is also common in looser tissues, like around your eyes.  Raise (elevate) the swollen body part above the level of your heart when you are sitting or lying down.  Follow your doctor's instructions about diet and how much fluid you can drink (fluid restriction). This information is not intended to replace advice given to you by your health care provider. Make sure you discuss any questions you have with your health care provider. Document Released: 07/13/2007 Document Revised: 02/12/2016 Document Reviewed: 02/12/2016 Elsevier Interactive Patient Education  2019 [employer].

## 2018-04-16 NOTE — Progress Notes (Signed)
Subjective:  Patient ID: Chelsea Rodriguez, female    DOB: 07/12/1960  Age: 58 y.o. MRN: [id number]  CC: Diabetes   HPI Chelsea Rodriguez is a 58 year old female with a history of type 2 diabetes mellitus (A1c 6.4), hypertension, dyslipidemia, peptic ulcer disease, previous DVT who comes in for follow-up of her diabetes mellitus. She complains of swelling of the dorsum of her right hand and endorses compliance with allopurinol and Colcrys.  Prior to this her last gout flare was 3 months ago.  Pain is only present in her right hand when she attempts to make a fist but is absent at rest. With regards to her diabetes mellitus she denies hypoglycemia, neuropathy in extremities, visual symptoms. Yet to undergo an annual eye exam. Peptic ulcer disease has been stable with no recent flares.  Past Medical History:  Diagnosis Date  . Anxiety   . Depression   . Diabetes mellitus without complication ([place])   . H/O mammogram 2016  . Hypertension   . Hypertension   . Lipid disorder   . Peptic ulcer disease 1996    Past Surgical History:  Procedure Laterality Date  . ABDOMINAL HYSTERECTOMY  2005  . CHOLECYSTECTOMY  2005  . COLONOSCOPY  2014  . GASTRIC BYPASS  2014    Family History  Problem Relation Age of Onset  . Diabetes Mother   . Heart disease Mother   . Breast cancer Mother   . Heart disease Father   . Diabetes Father     No Known Allergies  Outpatient Medications Prior to Visit  Medication Sig Dispense Refill  . ACCU-CHEK SOFTCLIX LANCETS lancets Use as instructed 3 times daily before meals. E11.9 100 each 12  . allopurinol (ZYLOPRIM) 300 MG tablet Take 1 tablet (300 mg total) by mouth daily. 90 tablet 1  . atorvastatin (LIPITOR) 20 MG tablet Take 1 tablet (20 mg total) by mouth daily. 90 tablet 1  . cetirizine (ZYRTEC) 10 MG tablet Take 1 tablet (10 mg total) by mouth daily. 90 tablet 1  . colchicine 0.6 MG tablet Take 1 tablet (0.6 mg total) by mouth daily. Take 2 tabs  at the onset of a gout flare, may repeat 1 tab in 2 hours if symptoms persist 30 tablet 2  . cyclobenzaprine (FLEXERIL) 10 MG tablet TAKE 1 TABLET TWICE DAILY AS NEEDED FOR MUSCLE SPASM(S) 90 tablet 1  . furosemide (LASIX) 20 MG tablet Take 1 tablet (20 mg total) by mouth every morning. 90 tablet 1  . gabapentin (NEURONTIN) 300 MG capsule Take 1 capsule (300 mg total) by mouth 2 (two) times daily. 180 capsule 1  . glucose blood (ACCU-CHEK AVIVA) test strip Use 3 times daily before meals. E11.9 100 each 12  . insulin detemir (LEVEMIR) 100 UNIT/ML injection Inject subcutaneously twice daily 25 units in the morning and 20 units in the evening 30 mL 5  . Insulin Pen Needle (B-D UF III MINI PEN NEEDLES) 31G X 5 MM MISC USE  AS  DIRECTED up to five times daily with insulin 500 each 1  . lisinopril (PRINIVIL,ZESTRIL) 5 MG tablet Take 1 tablet (5 mg total) by mouth daily. 90 tablet 1  . metFORMIN (GLUCOPHAGE) 1000 MG tablet TAKE 1 TABLET TWICE DAILY WITH A MEAL 180 tablet 1  . naproxen (NAPROSYN) 250 MG tablet TAKE 1 TABLET TWICE DAILY WITH MEALS 60 tablet 0  . omeprazole (PRILOSEC) 40 MG capsule Take 1 capsule (40 mg total) by mouth 2 (two) times daily.  180 capsule 1  . liraglutide (VICTOZA) 18 MG/3ML SOPN Inject 0.1 mLs (0.6 mg total) into the skin daily with breakfast. 9 pen 0  . predniSONE (DELTASONE) 20 MG tablet Take 1 tablet (20 mg total) by mouth 2 (two) times daily with a meal. 10 tablet 0  . carbamide peroxide (DEBROX) 6.5 % otic solution Place 5 drops into both ears 2 (two) times daily. (Patient not taking: Reported on 09/13/2017) 15 mL 0   No facility-administered medications prior to visit.      ROS Review of Systems General: negative for fever, weight loss, appetite change Eyes: no visual symptoms. ENT: no ear symptoms, no sinus tenderness, no nasal congestion or sore throat. Neck: no pain  Respiratory: no wheezing, shortness of breath, cough Cardiovascular: no chest pain, no dyspnea on  exertion, no pedal edema, no orthopnea. Gastrointestinal: no abdominal pain, no diarrhea, no constipation Genito-Urinary: no urinary frequency, no dysuria, no polyuria. Hematologic: no bruising Endocrine: no cold or heat intolerance Neurological: no headaches, no seizures, no tremors Musculoskeletal: see hpi Skin: no pruritus, no rash. Psychological: no depression, no anxiety,    Objective:  BP 99/62   Pulse 69   Temp 98 F (36.7 C) (Oral)   Ht _0  (1.676 m)   Wt 179 lb (81.2 kg)   SpO2 99%   BMI 28.89 kg/m   BP/Weight 04/16/2018 47/08/6149 09/10/4371  Systolic BP 99 99 578  Diastolic BP 62 59 65  Wt. (Lbs) 179 185.2 194.6  BMI 28.89 29.89 31.41      Physical Exam Constitutional: normal appearing,  Eyes: PERRLA HEENT: Head is atraumatic, normal sinuses, normal oropharynx, normal appearing tonsils and palate, tympanic membrane is normal bilaterally. Neck: normal range of motion, no thyromegaly, no JVD Cardiovascular: normal rate and rhythm, normal heart sounds, no murmurs, rub or gallop, 1_ R pitting pedal edema Respiratory: Normal breath sounds, clear to auscultation bilaterally, no wheezes, no rales, no rhonchi Abdomen: soft, not tender to palpation, normal bowel sounds, no enlarged organs Musculoskeletal: Edema of dorsum of right hand with no tenderness to palpation but tenderness on attempting to make a fist.  Left hand is normal Skin: warm and dry, no lesions. Neurological: alert, oriented x3, cranial nerves I-XII grossly intact , normal motor strength, normal sensation. Psychological: normal mood.   CMP Latest Ref Rng & Units 01/18/2018 03/23/2017 12/16/2016  Glucose 65 - 99 mg/dL 115(H) 96 69  BUN 6 - 24 mg/dL _1 Creatinine 0.57 - 1.00 mg/dL 1.13(H) 0.91 0.97  Sodium 134 - 144 mmol/L 145(H) 145(H) 147(H)  Potassium 3.5 - 5.2 mmol/L 4.1 5.0 4.4  Chloride 96 - 106 mmol/L 109(H) 111(H) 112(H)  CO2 20 - 29 mmol/L 19(L) 23 24  Calcium 8.7 - 10.2 mg/dL 9.0 9.4  8.9  Total Protein 6.0 - 8.5 g/dL 6.5 6.3 6.4  Total Bilirubin 0.0 - 1.2 mg/dL 0.2 0.2 <0.2  Alkaline Phos 39 - 117 IU/L 68 76 76  AST 0 - 40 IU/L 16 29 47(H)  ALT 0 - 32 IU/L 19 33(H) 48(H)    Lipid Panel     Component Value Date/Time   CHOL 97 (L) 03/23/2017 1008   TRIG 54 03/23/2017 1008   HDL 49 03/23/2017 1008   CHOLHDL 2.0 03/23/2017 1008   CHOLHDL 1.9 02/22/2016 0841   LDLCALC 37 03/23/2017 1008    CBC    Component Value Date/Time   WBC 6.2 05/18/2015 2024   RBC 3.89 05/18/2015 2024   HGB  10.2 (L) 06/18/2016 0859   HGB 12.5 02/13/2015 0817   HCT 30.0 (L) 06/18/2016 0859   HCT 37.3 02/13/2015 0817   PLT 261 05/18/2015 2024   PLT 277 02/13/2015 0817   MCV 84.8 05/18/2015 2024   MCV 85.7 02/13/2015 0817   MCH 28.0 05/18/2015 2024   MCHC 33.0 05/18/2015 2024   RDW 13.6 05/18/2015 2024   RDW 13.1 02/13/2015 0817   LYMPHSABS 2.4 05/18/2015 2024   LYMPHSABS 1.8 02/13/2015 0817   MONOABS 0.4 05/18/2015 2024   MONOABS 0.4 02/13/2015 0817   EOSABS 0.1 05/18/2015 2024   EOSABS 0.1 02/13/2015 0817   BASOSABS 0.0 05/18/2015 2024   BASOSABS 0.0 02/13/2015 0817    Lab Results  Component Value Date   HGBA1C 6.4 04/16/2018    Assessment & Plan:   1. Diabetes mellitus without complication ([place]) Controlled with A1c of 6.4 Continue current regimen Refer to ophthalmology for annual eye exam Counseled on Diabetic diet, my plate method, 532 minutes of moderate intensity exercise/week Keep blood sugar logs with fasting goals of 80-120 mg/dl, random of less than 180 and in the event of sugars less than 60 mg/dl or greater than 400 mg/dl please notify the clinic ASAP. It is recommended that you undergo annual eye exams and annual foot exams. Pneumonia vaccine is recommended. - POCT glucose (manual entry) - POCT glycosylated hemoglobin (Hb A1C) - CMP14+EGFR; Future - Lipid panel; Future - liraglutide (VICTOZA) 18 MG/3ML SOPN; Inject 0.1 mLs (0.6 mg total) into the skin  daily with breakfast.  Dispense: 9 pen; Refill: 6 - Microalbumin/Creatinine Ratio, Urine  2. Acute idiopathic gout of right hand Low purine eating plan Advised to increase Levemir by 2 units twice daily due to short course of prednisone Colchicine for gout flares and allopurinol for prevention - predniSONE (DELTASONE) 20 MG tablet; Take 1 tablet (20 mg total) by mouth 2 (two) times daily with a meal.  Dispense: 10 tablet; Refill: 0  3. Essential hypertension Controlled Continue antihypertensives Counseled on blood pressure goal of less than 130/80, low-sodium, DASH diet, medication compliance, 150 minutes of moderate intensity exercise per week. Discussed medication compliance, adverse effects.   4. Peptic ulcer disease No acute flare Continue PPI  5. Pedal edema Currently on Lasix Advised to use compression stockings  Healthcare maintenance-referred for colonoscopy in 05/2016 and GI notes indicate last colonoscopy was in 2012.  Meds ordered this encounter  Medications  . predniSONE (DELTASONE) 20 MG tablet    Sig: Take 1 tablet (20 mg total) by mouth 2 (two) times daily with a meal.    Dispense:  10 tablet    Refill:  0  . liraglutide (VICTOZA) 18 MG/3ML SOPN    Sig: Inject 0.1 mLs (0.6 mg total) into the skin daily with breakfast.    Dispense:  9 pen    Refill:  6    Follow-up: Return in about 3 months (around 07/17/2018) for Follow-up of chronic medical conditions.       Charlott Rakes, MD, FAAFP. [facility] [place], [place]   04/16/2018, 9:46 AM

## 2018-04-18 ENCOUNTER — Ambulatory Visit: Payer: Medicare Other | Attending: Family Medicine

## 2018-04-18 ENCOUNTER — Other Ambulatory Visit: Payer: Self-pay

## 2018-04-18 DIAGNOSIS — E119 Type 2 diabetes mellitus without complications: Secondary | ICD-10-CM

## 2018-04-19 LAB — CMP14+EGFR
ALBUMIN: 3.7 g/dL — AB (ref 3.8–4.9)
ALK PHOS: 76 IU/L (ref 39–117)
ALT: 51 IU/L — ABNORMAL HIGH (ref 0–32)
AST: 45 IU/L — ABNORMAL HIGH (ref 0–40)
Albumin/Globulin Ratio: 1.6 (ref 1.2–2.2)
BILIRUBIN TOTAL: 0.2 mg/dL (ref 0.0–1.2)
BUN / CREAT RATIO: 15 (ref 9–23)
BUN: 17 mg/dL (ref 6–24)
CHLORIDE: 107 mmol/L — AB (ref 96–106)
CO2: 23 mmol/L (ref 20–29)
Calcium: 9.3 mg/dL (ref 8.7–10.2)
Creatinine, Ser: 1.14 mg/dL — ABNORMAL HIGH (ref 0.57–1.00)
GFR calc Af Amer: 61 mL/min/{1.73_m2} (ref >59)
GFR calc non Af Amer: 53 mL/min/{1.73_m2} — ABNORMAL LOW (ref >59)
GLOBULIN, TOTAL: 2.3 g/dL (ref 1.5–4.5)
Glucose: 93 mg/dL (ref 65–99)
POTASSIUM: 4.7 mmol/L (ref 3.5–5.2)
SODIUM: 143 mmol/L (ref 134–144)
Total Protein: 6 g/dL (ref 6.0–8.5)

## 2018-04-19 LAB — LIPID PANEL
Chol/HDL Ratio: 2 ratio (ref 0.0–4.4)
Cholesterol, Total: 89 mg/dL — ABNORMAL LOW (ref 100–199)
HDL: 45 mg/dL (ref >39)
LDL Calculated: 34 mg/dL (ref 0–99)
Triglycerides: 50 mg/dL (ref 0–149)
VLDL CHOLESTEROL CAL: 10 mg/dL (ref 5–40)

## 2018-04-20 ENCOUNTER — Encounter: Payer: Self-pay | Admitting: Podiatry

## 2018-04-20 ENCOUNTER — Telehealth: Payer: Self-pay

## 2018-04-20 NOTE — Progress Notes (Signed)
Subjective: Chelsea Rodriguez presents with diabetes, diabetic neuropathy and cc of painful, discolored, thick toenails and painful callus/corn which interfere with activities of daily living. Pain is aggravated when wearing enclosed shoe gear. Pain is relieved with periodic professional debridement.  Hoy Register, MD is her PCP.  Last visit January 18, 2018.  Her last hemoglobin A1c was 7.4.  Her neuropathy is managed with gabapentin.   Current Outpatient Medications:  .  ACCU-CHEK SOFTCLIX LANCETS lancets, Use as instructed 3 times daily before meals. E11.9, Disp: 100 each, Rfl: 12 .  allopurinol (ZYLOPRIM) 300 MG tablet, Take 1 tablet (300 mg total) by mouth daily., Disp: 90 tablet, Rfl: 1 .  atorvastatin (LIPITOR) 20 MG tablet, Take 1 tablet (20 mg total) by mouth daily., Disp: 90 tablet, Rfl: 1 .  carbamide peroxide (DEBROX) 6.5 % otic solution, Place 5 drops into both ears 2 (two) times daily. (Patient not taking: Reported on 09/13/2017), Disp: 15 mL, Rfl: 0 .  cetirizine (ZYRTEC) 10 MG tablet, Take 1 tablet (10 mg total) by mouth daily., Disp: 90 tablet, Rfl: 1 .  colchicine 0.6 MG tablet, Take 1 tablet (0.6 mg total) by mouth daily. Take 2 tabs at the onset of a gout flare, may repeat 1 tab in 2 hours if symptoms persist, Disp: 30 tablet, Rfl: 2 .  cyclobenzaprine (FLEXERIL) 10 MG tablet, TAKE 1 TABLET TWICE DAILY AS NEEDED FOR MUSCLE SPASM(S), Disp: 90 tablet, Rfl: 1 .  furosemide (LASIX) 20 MG tablet, Take 1 tablet (20 mg total) by mouth every morning., Disp: 90 tablet, Rfl: 1 .  gabapentin (NEURONTIN) 300 MG capsule, Take 1 capsule (300 mg total) by mouth 2 (two) times daily., Disp: 180 capsule, Rfl: 1 .  glucose blood (ACCU-CHEK AVIVA) test strip, Use 3 times daily before meals. E11.9, Disp: 100 each, Rfl: 12 .  insulin detemir (LEVEMIR) 100 UNIT/ML injection, Inject subcutaneously twice daily 25 units in the morning and 20 units in the evening, Disp: 30 mL, Rfl: 5 .  Insulin Pen  Needle (B-D UF III MINI PEN NEEDLES) 31G X 5 MM MISC, USE  AS  DIRECTED up to five times daily with insulin, Disp: 500 each, Rfl: 1 .  liraglutide (VICTOZA) 18 MG/3ML SOPN, Inject 0.1 mLs (0.6 mg total) into the skin daily with breakfast., Disp: 9 pen, Rfl: 6 .  lisinopril (PRINIVIL,ZESTRIL) 5 MG tablet, Take 1 tablet (5 mg total) by mouth daily., Disp: 90 tablet, Rfl: 1 .  metFORMIN (GLUCOPHAGE) 1000 MG tablet, TAKE 1 TABLET TWICE DAILY WITH A MEAL, Disp: 180 tablet, Rfl: 1 .  naproxen (NAPROSYN) 250 MG tablet, TAKE 1 TABLET TWICE DAILY WITH MEALS, Disp: 60 tablet, Rfl: 0 .  omeprazole (PRILOSEC) 40 MG capsule, Take 1 capsule (40 mg total) by mouth 2 (two) times daily., Disp: 180 capsule, Rfl: 1 .  predniSONE (DELTASONE) 20 MG tablet, Take 1 tablet (20 mg total) by mouth 2 (two) times daily with a meal., Disp: 10 tablet, Rfl: 0  No Known Allergies  Vascular Examination: Capillary refill time <3 seconds x 10 digits.  Dorsalis pedis and Posterior tibial pulses present b/l.  No digital hair x 10 digits.  Skin temperature gradient within normal limits bilaterally.  Dermatological Examination: Skin with normal turgor, texture and tone b/l.  Toenails 1-5 b/l discolored, thick, dystrophic with subungual debris and pain with palpation to nailbeds due to thickness of nails.  Hyperkeratotic lesion submetatarsal head 5 bilaterally.  No erythema, no edema, no drainage, no flocculence noted.  No interdigital macerations noted.  No open wounds noted bilaterally.  Musculoskeletal: Muscle strength 5/5 to all LE muscle groups.  Neurological: Sensation diminished with 10 gram monofilament. Vibratory sensation diminished  Assessment: 1. Painful onychomycosis toenails 1-5 b/l 2. Calluses submetatarsal head 5 bilaterally 3. NIDDM with Diabetic neuropathy  Plan: 1. Continue diabetic foot care principles.  Literature dispensed on today. 2. Toenails 1-5 b/l were debrided in length and girth  without iatrogenic bleeding. 3. Hyperkeratotic lesion pared submetatarsal head 5 bilaterally with sterile scalpel blade without incident. 4. Patient to continue soft, supportive shoe gear. 5. Patient to report any pedal injuries to medical professional . 6. Follow up 3 months.  7. Patient/POA to call should there be a concern in the interim.

## 2018-04-20 NOTE — Telephone Encounter (Signed)
-----   Message from Hoy Register, MD sent at 04/19/2018  2:43 PM EDT ----- Labs are stable

## 2018-04-20 NOTE — Telephone Encounter (Signed)
Patient was called and informed of lab results. Patient had no questions.  

## 2018-04-25 ENCOUNTER — Ambulatory Visit: Payer: Medicaid Other | Admitting: Family Medicine

## 2018-06-06 ENCOUNTER — Other Ambulatory Visit: Payer: Self-pay | Admitting: Family Medicine

## 2018-06-06 DIAGNOSIS — E789 Disorder of lipoprotein metabolism, unspecified: Secondary | ICD-10-CM

## 2018-06-06 DIAGNOSIS — Z794 Long term (current) use of insulin: Principal | ICD-10-CM

## 2018-06-06 DIAGNOSIS — I10 Essential (primary) hypertension: Secondary | ICD-10-CM

## 2018-06-06 DIAGNOSIS — E1169 Type 2 diabetes mellitus with other specified complication: Secondary | ICD-10-CM

## 2018-06-06 DIAGNOSIS — K279 Peptic ulcer, site unspecified, unspecified as acute or chronic, without hemorrhage or perforation: Secondary | ICD-10-CM

## 2018-06-06 DIAGNOSIS — E1149 Type 2 diabetes mellitus with other diabetic neurological complication: Secondary | ICD-10-CM

## 2018-06-08 ENCOUNTER — Other Ambulatory Visit: Payer: Self-pay | Admitting: Pharmacist

## 2018-06-08 DIAGNOSIS — I10 Essential (primary) hypertension: Secondary | ICD-10-CM

## 2018-06-08 DIAGNOSIS — E789 Disorder of lipoprotein metabolism, unspecified: Secondary | ICD-10-CM

## 2018-06-08 DIAGNOSIS — E1169 Type 2 diabetes mellitus with other specified complication: Secondary | ICD-10-CM

## 2018-06-08 DIAGNOSIS — Z794 Long term (current) use of insulin: Principal | ICD-10-CM

## 2018-06-08 MED ORDER — ALLOPURINOL 300 MG PO TABS: 300.0000 mg | ORAL_TABLET | Freq: Every day | ORAL | 0 refills | Status: DC

## 2018-06-08 MED ORDER — METFORMIN HCL 1000 MG PO TABS: ORAL_TABLET | ORAL | 0 refills | Status: DC

## 2018-06-08 MED ORDER — LISINOPRIL 5 MG PO TABS: 5.0000 mg | ORAL_TABLET | Freq: Every day | ORAL | 0 refills | Status: DC

## 2018-06-08 MED ORDER — ATORVASTATIN CALCIUM 20 MG PO TABS: 20.0000 mg | ORAL_TABLET | Freq: Every day | ORAL | 0 refills | Status: DC

## 2018-06-11 ENCOUNTER — Other Ambulatory Visit: Payer: Self-pay | Admitting: Family Medicine

## 2018-07-06 ENCOUNTER — Other Ambulatory Visit: Payer: Self-pay

## 2018-07-06 NOTE — Patient Outreach (Signed)
[facility]) Care Management  07/06/2018  Chelsea Rodriguez 18-Mar-1960 [id number]   Medication Adherence call to Chelsea Rodriguez patient did not answer patient is due on Atorvastatin 20 mg and Lisinopril 5 mg under [employer].   Lillia Abed CPhT Pharmacy Technician [facility] Care Management Direct Dial [phone]  Fax [phone] [email or web address]

## 2018-07-12 ENCOUNTER — Other Ambulatory Visit: Payer: Self-pay

## 2018-07-12 ENCOUNTER — Ambulatory Visit: Payer: Medicare Other | Admitting: Family Medicine

## 2018-07-12 NOTE — Patient Outreach (Signed)
[facility]) Care Management  07/12/2018  Chelsea Rodriguez 01/20/1961 [id number]   Medication Adherence call to Chelsea Rodriguez patient did not answer patient is past due on Atorvastatin 20 mg and Lisinopril 5 mg under [employer].   Lillia Abed CPhT Pharmacy Technician [facility] Care Management Direct Dial [phone]  Fax [phone] [email or web address]

## 2018-07-19 ENCOUNTER — Ambulatory Visit: Payer: Medicare Other | Admitting: Podiatry

## 2018-08-31 ENCOUNTER — Other Ambulatory Visit: Payer: Self-pay | Admitting: Family Medicine

## 2018-08-31 DIAGNOSIS — E1169 Type 2 diabetes mellitus with other specified complication: Secondary | ICD-10-CM

## 2018-08-31 DIAGNOSIS — I10 Essential (primary) hypertension: Secondary | ICD-10-CM

## 2018-08-31 DIAGNOSIS — E789 Disorder of lipoprotein metabolism, unspecified: Secondary | ICD-10-CM

## 2018-10-16 ENCOUNTER — Other Ambulatory Visit: Payer: Self-pay

## 2018-10-16 NOTE — Patient Outreach (Signed)
[facility]) Care Management  10/16/2018  Chelsea Rodriguez 04-06-1960 [id number]   Medication Adherence call to Chelsea Rodriguez Telephone call to Patient regarding Medication Adherence unable to reach patient. Chelsea Rodriguez is showing past due on Atorvastatin 20 mg and Lisinopril 5 mg under [facility].  [facility] Management Direct Dial [phone]  Fax [phone] [email or web address]

## 2018-10-22 DIAGNOSIS — R6889 Other general symptoms and signs: Secondary | ICD-10-CM | POA: Diagnosis not present

## 2018-10-22 DIAGNOSIS — M109 Gout, unspecified: Secondary | ICD-10-CM | POA: Diagnosis not present

## 2018-10-22 DIAGNOSIS — I10 Essential (primary) hypertension: Secondary | ICD-10-CM | POA: Diagnosis not present

## 2018-10-22 DIAGNOSIS — R609 Edema, unspecified: Secondary | ICD-10-CM | POA: Diagnosis not present

## 2018-10-22 DIAGNOSIS — Z9884 Bariatric surgery status: Secondary | ICD-10-CM | POA: Diagnosis not present

## 2018-10-22 DIAGNOSIS — E1169 Type 2 diabetes mellitus with other specified complication: Secondary | ICD-10-CM | POA: Diagnosis not present

## 2018-10-22 DIAGNOSIS — E785 Hyperlipidemia, unspecified: Secondary | ICD-10-CM | POA: Diagnosis not present

## 2018-10-25 DIAGNOSIS — I10 Essential (primary) hypertension: Secondary | ICD-10-CM | POA: Diagnosis not present

## 2018-10-25 DIAGNOSIS — Z794 Long term (current) use of insulin: Secondary | ICD-10-CM | POA: Diagnosis not present

## 2018-10-25 DIAGNOSIS — S20219A Contusion of unspecified front wall of thorax, initial encounter: Secondary | ICD-10-CM | POA: Diagnosis not present

## 2018-10-25 DIAGNOSIS — S299XXA Unspecified injury of thorax, initial encounter: Secondary | ICD-10-CM | POA: Diagnosis not present

## 2018-10-25 DIAGNOSIS — E119 Type 2 diabetes mellitus without complications: Secondary | ICD-10-CM | POA: Diagnosis not present

## 2018-11-05 DIAGNOSIS — W19XXXA Unspecified fall, initial encounter: Secondary | ICD-10-CM | POA: Diagnosis not present

## 2018-11-05 DIAGNOSIS — M25559 Pain in unspecified hip: Secondary | ICD-10-CM | POA: Diagnosis not present

## 2018-11-05 DIAGNOSIS — S20211A Contusion of right front wall of thorax, initial encounter: Secondary | ICD-10-CM | POA: Diagnosis not present

## 2018-11-05 DIAGNOSIS — E1169 Type 2 diabetes mellitus with other specified complication: Secondary | ICD-10-CM | POA: Diagnosis not present
